# Patient Record
Sex: Female | Born: 1985 | Race: White | Hispanic: No | Marital: Married | Smoking: Never smoker
Health system: Southern US, Community
[De-identification: ages and names within clinical notes are randomized; demographics above are authoritative.]

## PROBLEM LIST (undated history)

## (undated) DIAGNOSIS — T7840XA Allergy, unspecified, initial encounter: Secondary | ICD-10-CM

## (undated) DIAGNOSIS — Z789 Other specified health status: Secondary | ICD-10-CM

## (undated) HISTORY — PX: COSMETIC SURGERY: SHX468

## (undated) HISTORY — PX: OTHER SURGICAL HISTORY: SHX169

## (undated) HISTORY — DX: Allergy, unspecified, initial encounter: T78.40XA

## (undated) HISTORY — PX: WISDOM TOOTH EXTRACTION: SHX21

## (undated) HISTORY — PX: PATELLAR TENDON REPAIR: SHX737

---

## 2012-10-01 ENCOUNTER — Ambulatory Visit (INDEPENDENT_AMBULATORY_CARE_PROVIDER_SITE_OTHER): Payer: BC Managed Care – PPO | Admitting: Women's Health

## 2012-10-01 ENCOUNTER — Encounter: Payer: Self-pay | Admitting: Women's Health

## 2012-10-01 ENCOUNTER — Other Ambulatory Visit (HOSPITAL_COMMUNITY)
Admission: RE | Admit: 2012-10-01 | Discharge: 2012-10-01 | Disposition: A | Discharge status: Home / Self Care | Payer: BC Managed Care – PPO | Source: Ambulatory Visit | Attending: Women's Health | Admitting: Women's Health

## 2012-10-01 VITALS — BP 116/70 | Ht 68.75 in | Wt 187.0 lb

## 2012-10-01 DIAGNOSIS — Z01419 Encounter for gynecological examination (general) (routine) without abnormal findings: Secondary | ICD-10-CM | POA: Insufficient documentation

## 2012-10-01 DIAGNOSIS — E070 Hypersecretion of calcitonin: Secondary | ICD-10-CM

## 2012-10-01 DIAGNOSIS — IMO0001 Reserved for inherently not codable concepts without codable children: Secondary | ICD-10-CM

## 2012-10-01 DIAGNOSIS — Z309 Encounter for contraceptive management, unspecified: Secondary | ICD-10-CM

## 2012-10-01 DIAGNOSIS — Z113 Encounter for screening for infections with a predominantly sexual mode of transmission: Secondary | ICD-10-CM

## 2012-10-01 LAB — CBC WITH DIFFERENTIAL/PLATELET
Eosinophils Absolute: 0.1 10*3/uL (ref 0.0–0.7)
Eosinophils Relative: 1 % (ref 0–5)
HCT: 37.6 % (ref 36.0–46.0)
Hemoglobin: 13.3 g/dL (ref 12.0–15.0)
Lymphs Abs: 1.7 10*3/uL (ref 0.7–4.0)
MCH: 31.1 pg (ref 26.0–34.0)
MCV: 88.1 fL (ref 78.0–100.0)
Monocytes Relative: 6 % (ref 3–12)
Neutrophils Relative %: 69 % (ref 43–77)
RBC: 4.27 MIL/uL (ref 3.87–5.11)

## 2012-10-01 MED ORDER — NORETHINDRONE ACET-ETHINYL EST 1-20 MG-MCG PO TABS: 1.0000 | ORAL_TABLET | Freq: Every day | ORAL | Status: DC

## 2012-10-01 NOTE — Progress Notes (Addendum)
Laurie Gardner Jan 03, 1986 [id number]    History:    The patient presents for annual exam.  Regular monthly cycle until last month, cycle was 2 weeks late, 2 negative home U PTs. New partner in last 6 months/condoms. History of normal Paps, last one being greater than 2 years ago. Gardasil series completed in high school. Has gained approximately 40 pounds in last 5 years.  Past medical history, past surgical history, family history and social history were all reviewed and documented in the EPIC chart. Graduated from [employer], basketball scholarship. Wanting to be ultrasonographer. Mother with melanoma.  ROS:  A  ROS was performed and pertinent positives and negatives are included in the history.  Exam:  Filed Vitals:   10/01/12 1456  BP: 116/70    General appearance:  Normal Head/Neck:  Normal, without cervical or supraclavicular adenopathy. Thyroid:  Symmetrical, normal in size, without palpable masses or nodularity. Respiratory  Effort:  Normal  Auscultation:  Clear without wheezing or rhonchi Cardiovascular  Auscultation:  Regular rate, without rubs, murmurs or gallops  Edema/varicosities:  Not grossly evident Abdominal  Soft,nontender, without masses, guarding or rebound.  Liver/spleen:  No organomegaly noted  Hernia:  None appreciated  Skin  Inspection:  Grossly normal  Palpation:  Grossly normal Neurologic/psychiatric  Orientation:  Normal with appropriate conversation.  Mood/affect:  Normal  Genitourinary    Breasts: Examined lying and sitting.     Right: Without masses, retractions, discharge or axillary adenopathy.     Left: Without masses, retractions, discharge or axillary adenopathy.   Inguinal/mons:  Normal without inguinal adenopathy  External genitalia:  Normal  BUS/Urethra/Skene's glands:  Normal  Bladder:  Normal  Vagina:  Normal  Cervix:  Normal  Uterus:   normal in size, shape and contour.  Midline and mobile  Adnexa/parametria:      Rt: Without masses or tenderness.   Lt: Without masses or tenderness.  Anus and perineum: Normal  Digital rectal exam: Normal sphincter tone without palpated masses or tenderness  Assessment/Plan:  27 y.o. S. WF G0 for annual exam with no complaints.  Normal GYN exam Contraception counseling STD screen  Plan: Contraception options reviewed, will try Loestrin 1/20 prescription, proper use, slight risk for blood clots and strokes reviewed. Reviewed importance of condoms until next cycle and first month on. Has used pills in the past without a problem. SBE's, increase regular exercise and decrease calories for weight loss, MVI daily encouraged. CBC, TSH, UA, Pap, GC/Chlamydia, HIV, hep B, C., RPR. Continue annual skin checks.     Harrington Challenger [facility], 3:27 PM 10/01/2012

## 2012-10-01 NOTE — Patient Instructions (Signed)

## 2012-10-02 LAB — URINALYSIS W MICROSCOPIC + REFLEX CULTURE
Casts: NONE SEEN
Glucose, UA: NEGATIVE mg/dL
Hgb urine dipstick: NEGATIVE
Leukocytes, UA: NEGATIVE
pH: 7 (ref 5.0–8.0)

## 2012-10-02 LAB — TSH: TSH: 1.562 u[IU]/mL (ref 0.350–4.500)

## 2012-10-02 LAB — HEPATITIS C ANTIBODY: HCV Ab: NEGATIVE

## 2012-10-02 LAB — GC/CHLAMYDIA PROBE AMP
CT Probe RNA: NEGATIVE
GC Probe RNA: NEGATIVE

## 2012-10-03 ENCOUNTER — Other Ambulatory Visit: Payer: Self-pay | Admitting: Women's Health

## 2012-10-03 MED ORDER — FLUCONAZOLE 150 MG PO TABS: 150.0000 mg | ORAL_TABLET | Freq: Once | ORAL | Status: DC

## 2012-11-15 ENCOUNTER — Telehealth: Payer: Self-pay | Admitting: *Deleted

## 2012-11-15 NOTE — Telephone Encounter (Signed)
Pt called c/o brownish discharge with started new birth control pills, I explained to pt that this is not uncommon may have irregualar spotting while on pills. I told her to make OV if any signs of infection. Pt okay with all the above.

## 2013-05-30 ENCOUNTER — Other Ambulatory Visit: Payer: Self-pay

## 2013-06-16 ENCOUNTER — Encounter: Payer: Self-pay | Admitting: Women's Health

## 2013-06-18 ENCOUNTER — Encounter: Payer: Self-pay | Admitting: Women's Health

## 2014-01-08 ENCOUNTER — Ambulatory Visit (INDEPENDENT_AMBULATORY_CARE_PROVIDER_SITE_OTHER): Payer: BC Managed Care – PPO | Admitting: Physician Assistant

## 2014-01-08 ENCOUNTER — Encounter: Payer: Self-pay | Admitting: Physician Assistant

## 2014-01-08 VITALS — BP 110/62 | HR 80 | Temp 99.7°F | Resp 16 | Ht 68.75 in | Wt 170.6 lb

## 2014-01-08 DIAGNOSIS — T148XXA Other injury of unspecified body region, initial encounter: Secondary | ICD-10-CM

## 2014-01-08 DIAGNOSIS — Z13220 Encounter for screening for lipoid disorders: Secondary | ICD-10-CM

## 2014-01-08 DIAGNOSIS — Z1329 Encounter for screening for other suspected endocrine disorder: Secondary | ICD-10-CM

## 2014-01-08 DIAGNOSIS — Z0000 Encounter for general adult medical examination without abnormal findings: Secondary | ICD-10-CM

## 2014-01-08 LAB — CBC WITH DIFFERENTIAL/PLATELET
BASOS PCT: 0 % (ref 0–1)
Basophils Absolute: 0 10*3/uL (ref 0.0–0.1)
EOS ABS: 0.1 10*3/uL (ref 0.0–0.7)
Eosinophils Relative: 1 % (ref 0–5)
HCT: 38 % (ref 36.0–46.0)
HEMOGLOBIN: 13.4 g/dL (ref 12.0–15.0)
LYMPHS ABS: 1.9 10*3/uL (ref 0.7–4.0)
Lymphocytes Relative: 29 % (ref 12–46)
MCH: 31.4 pg (ref 26.0–34.0)
MCHC: 35.3 g/dL (ref 30.0–36.0)
MCV: 89 fL (ref 78.0–100.0)
MONOS PCT: 5 % (ref 3–12)
Monocytes Absolute: 0.3 10*3/uL (ref 0.1–1.0)
Neutro Abs: 4.2 10*3/uL (ref 1.7–7.7)
Neutrophils Relative %: 65 % (ref 43–77)
PLATELETS: 218 10*3/uL (ref 150–400)
RBC: 4.27 MIL/uL (ref 3.87–5.11)
RDW: 13.4 % (ref 11.5–15.5)
WBC: 6.4 10*3/uL (ref 4.0–10.5)

## 2014-01-08 LAB — POCT URINALYSIS DIPSTICK
BILIRUBIN UA: NEGATIVE
GLUCOSE UA: NEGATIVE
KETONES UA: NEGATIVE
Leukocytes, UA: NEGATIVE
NITRITE UA: NEGATIVE
PH UA: 7
Protein, UA: NEGATIVE
RBC UA: NEGATIVE
SPEC GRAV UA: 1.01
Urobilinogen, UA: 0.2

## 2014-01-08 NOTE — Progress Notes (Signed)
   Subjective:    Patient ID: Laurie Gardner, female    DOB: Nov 24, 1985, 28 y.o.   MRN: [id number]  HPI    Review of Systems  Constitutional: Negative.   HENT: Negative.   Eyes: Negative.   Respiratory: Negative.   Cardiovascular: Negative.   Gastrointestinal: Negative.   Endocrine: Negative.   Genitourinary: Negative.   Musculoskeletal: Negative.   Skin: Negative.   Allergic/Immunologic: Negative.   Neurological: Negative.   Hematological: Negative.   Psychiatric/Behavioral: Negative.        Objective:   Physical Exam        Assessment & Plan:

## 2014-01-08 NOTE — Progress Notes (Signed)
   Subjective:    Patient ID: Laurie Gardner, female    DOB: [birth year], 28 y.o.   MRN: [id number]  HPI Pt presents to clinic for her yearly physical.  She is doing well.  Her only concern is she intermittently gets big tender bruises but does not that she does tend to run into things.  She has had no epistaxis or bleeding gums.  She is trying to get pregnant and has not yet started prenatal vitamins.  Plays basketball for fun.  Review of Systems  Constitutional: Negative.   Eyes: Negative.   Respiratory: Negative.   Cardiovascular: Negative.   Gastrointestinal: Positive for nausea (rare in the am esp after a late full meal). Negative for vomiting, abdominal pain, diarrhea, constipation and blood in stool.  Endocrine: Negative.   Genitourinary: Negative.   Musculoskeletal: Negative.   Skin: Negative.   Allergic/Immunologic: Negative.   Neurological: Positive for headaches (rare - relieved with tylenol or motrin).  Hematological: Bruises/bleeds easily.  Psychiatric/Behavioral: Negative.        Objective:   Physical Exam  Vitals reviewed. Constitutional: She is oriented to person, place, and time. She appears well-developed and well-nourished.  HENT:  Head: Normocephalic and atraumatic.  Right Ear: Hearing, tympanic membrane, external ear and ear canal normal.  Left Ear: Hearing, tympanic membrane, external ear and ear canal normal.  Nose: Nose normal.  Mouth/Throat: Uvula is midline, oropharynx is clear and moist and mucous membranes are normal.  Eyes: Conjunctivae and EOM are normal. Pupils are equal, round, and reactive to light.  Neck: Normal range of motion. Neck supple.  Cardiovascular: Normal rate, regular rhythm and normal heart sounds.   No murmur heard. Pulmonary/Chest: Effort normal and breath sounds normal.  Abdominal: Soft. Bowel sounds are normal.  Musculoskeletal: Normal range of motion.  Neurological: She is alert and oriented to person, place, and time. She has  normal reflexes.  Skin: Skin is warm and dry.  ecchymosis on posterior calf near posterior knee fossa - no associated erythema  Psychiatric: She has a normal mood and affect. Her behavior is normal. Judgment and thought content normal.      Assessment & Plan:  Annual physical exam - Plan: COMPLETE METABOLIC PANEL WITH GFR, POCT urinalysis dipstick  Screening for cholesterol level - Plan: Lipid panel  Screening for thyroid disorder - Plan: TSH  Bruising - without other bleeding problems this is most likely related to trauma she will continue to monitor - Plan: CBC with Differential  Pt should start a PNV due to trying to get pregnant.  Windell Hummingbird PA-C  [facility] 01/08/2014 1:15 PM

## 2014-01-08 NOTE — Patient Instructions (Signed)
I will contact you with your lab results as soon as they are available.   If you have not heard from me in 2 weeks, please contact me.  The fastest way to get your results is to register for My Chart (see the instructions on the last page of this printout).   

## 2014-01-09 LAB — COMPLETE METABOLIC PANEL WITH GFR
ALT: 10 U/L (ref 0–35)
AST: 14 U/L (ref 0–37)
Albumin: 4.2 g/dL (ref 3.5–5.2)
Alkaline Phosphatase: 43 U/L (ref 39–117)
BILIRUBIN TOTAL: 0.4 mg/dL (ref 0.2–1.2)
BUN: 8 mg/dL (ref 6–23)
CO2: 24 meq/L (ref 19–32)
CREATININE: 0.68 mg/dL (ref 0.50–1.10)
Calcium: 9.1 mg/dL (ref 8.4–10.5)
Chloride: 103 mEq/L (ref 96–112)
GLUCOSE: 77 mg/dL (ref 70–99)
Potassium: 4.1 mEq/L (ref 3.5–5.3)
Sodium: 139 mEq/L (ref 135–145)
Total Protein: 6.7 g/dL (ref 6.0–8.3)

## 2014-01-09 LAB — LIPID PANEL
CHOL/HDL RATIO: 3.8 ratio
CHOLESTEROL: 144 mg/dL (ref 0–200)
HDL: 38 mg/dL — ABNORMAL LOW (ref >39)
LDL Cholesterol: 78 mg/dL (ref 0–99)
TRIGLYCERIDES: 140 mg/dL (ref <150)
VLDL: 28 mg/dL (ref 0–40)

## 2014-01-09 LAB — TSH: TSH: 1.299 u[IU]/mL (ref 0.350–4.500)

## 2014-06-26 ENCOUNTER — Encounter: Payer: Self-pay | Admitting: Physician Assistant

## 2014-06-27 NOTE — Telephone Encounter (Signed)
To scheduling.

## 2014-12-12 ENCOUNTER — Encounter: Payer: Self-pay | Admitting: Physician Assistant

## 2014-12-12 ENCOUNTER — Telehealth: Payer: Self-pay | Admitting: Family Medicine

## 2014-12-12 NOTE — Telephone Encounter (Signed)
lmom to call us back to set up another appt with Windell Hummingbird for a CPE

## 2014-12-17 ENCOUNTER — Encounter: Payer: BC Managed Care – PPO | Admitting: Physician Assistant

## 2014-12-30 ENCOUNTER — Other Ambulatory Visit: Payer: Self-pay | Admitting: Obstetrics and Gynecology

## 2014-12-31 LAB — CYTOLOGY - PAP

## 2015-01-28 ENCOUNTER — Encounter: Payer: Self-pay | Admitting: Physician Assistant

## 2015-02-11 ENCOUNTER — Ambulatory Visit (INDEPENDENT_AMBULATORY_CARE_PROVIDER_SITE_OTHER): Payer: BLUE CROSS/BLUE SHIELD | Admitting: Physician Assistant

## 2015-02-11 ENCOUNTER — Encounter: Payer: Self-pay | Admitting: Physician Assistant

## 2015-02-11 VITALS — BP 112/70 | HR 101 | Temp 98.7°F | Resp 16 | Ht 68.75 in | Wt 157.8 lb

## 2015-02-11 DIAGNOSIS — Z0000 Encounter for general adult medical examination without abnormal findings: Secondary | ICD-10-CM | POA: Diagnosis not present

## 2015-02-11 NOTE — Progress Notes (Signed)
Laurie Gardner  MRN: [id number] DOB: 10/11/1985  Subjective:  Pt presents to clinic for her CPE.  She is having no problems or concerns this year.  She is engaged and they are still not preventing pregnancy.  Last dental exam: > 1 year Last vision exam: no Last pap smear: June 2016 Vaccinations      Tetanus - UTD      HPV - UTD  There are no active problems to display for this patient.   No current outpatient prescriptions on file prior to visit.   No current facility-administered medications on file prior to visit.    Allergies  Allergen Reactions  . Penicillins Rash    History   Social History  . Marital Status: Single    Spouse Name: N/A  . Number of Children: N/A  . Years of Education: N/A   Occupational History  . 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 Smoking status: Never Smoker   . Smokeless tobacco: Never Used  . Alcohol Use: Yes     Comment: not often  . Drug Use: No  . Sexual Activity:    Partners: Male    Birth Control/ Protection: Condom   Other Topics Concern  . None   Social History Narrative   Single, engaged. Education: [employer]. Exercise: Yes   Works at [employer] - athletic department    Past Surgical History  Procedure Laterality Date  . Arthroscopic knee surgery      left knee  . Patellar tendon repair Left     Family History  Problem Relation Age of Onset  . Melanoma Mother   . Migraines Mother   . Melanoma Maternal Grandmother   . Cancer Paternal Grandmother     Review of Systems  Constitutional: Negative.   HENT: Negative.   Eyes: Negative.   Respiratory: Negative.   Cardiovascular: Negative.   Gastrointestinal: Negative.   Endocrine: Negative.   Genitourinary: Negative.   Musculoskeletal: Negative.   Skin: Negative.   Allergic/Immunologic: Negative.   Neurological: Negative.   Hematological: Negative.   Psychiatric/Behavioral: Negative.    Objective:  BP 112/70 mmHg  Pulse  101  Temp(Src) 98.7 F (37.1 C) (Oral)  Resp 16  Ht 5' 8.75" (1.746 m)  Wt 157 lb 12.8 oz (71.578 kg)  BMI 23.48 kg/m2  SpO2 98%  LMP 01/27/2015  Physical Exam  Constitutional: She is oriented to person, place, and time and well-developed, well-nourished, and in no distress.  HENT:  Head: Normocephalic and atraumatic.  Right Ear: Hearing, tympanic membrane, external ear and ear canal normal.  Left Ear: Hearing, tympanic membrane, external ear and ear canal normal.  Nose: Nose normal.  Mouth/Throat: Uvula is midline, oropharynx is clear and moist and mucous membranes are normal.  Eyes: Conjunctivae and EOM are normal. Pupils are equal, round, and reactive to light.  Neck: Trachea normal and normal range of motion. Neck supple. No thyroid mass and no thyromegaly present.  Cardiovascular: Normal rate, regular rhythm and normal heart sounds.   No murmur heard. Pulmonary/Chest: Effort normal and breath sounds normal. She has no wheezes.  Abdominal: Soft. Bowel sounds are normal. There is no tenderness.  Musculoskeletal: Normal range of motion.  Linear scar left anterior knee  Lymphadenopathy:    She has no cervical adenopathy.  Neurological: She is alert and oriented to person, place, and time. She has normal motor skills, normal sensation, normal strength and normal reflexes. Gait normal.  Skin: Skin is  warm and dry.  Psychiatric: Mood, memory, affect and judgment normal.     Visual Acuity Screening   Right eye Left eye Both eyes  Without correction: 20/20 20/15 20/13   With correction:       Assessment and Plan :  Annual physical exam   Anticipatory Guidance given.  Questions answered.  Windell Hummingbird PA-C  [facility] 02/11/2015 1:39 PM

## 2015-04-13 ENCOUNTER — Encounter: Payer: Self-pay | Admitting: Physician Assistant

## 2015-12-22 ENCOUNTER — Encounter: Payer: Self-pay | Admitting: Physician Assistant

## 2015-12-25 ENCOUNTER — Ambulatory Visit (INDEPENDENT_AMBULATORY_CARE_PROVIDER_SITE_OTHER): Payer: BLUE CROSS/BLUE SHIELD | Admitting: Family Medicine

## 2015-12-25 VITALS — BP 124/78 | HR 89 | Temp 98.1°F | Resp 16 | Ht 68.75 in | Wt 170.8 lb

## 2015-12-25 DIAGNOSIS — Z808 Family history of malignant neoplasm of other organs or systems: Secondary | ICD-10-CM

## 2015-12-25 DIAGNOSIS — D225 Melanocytic nevi of trunk: Secondary | ICD-10-CM | POA: Diagnosis not present

## 2015-12-25 DIAGNOSIS — Z0000 Encounter for general adult medical examination without abnormal findings: Secondary | ICD-10-CM

## 2015-12-25 NOTE — Patient Instructions (Addendum)
IF you received an x-ray today, you will receive an invoice from [facility]. Please contact [facility] at [phone] with questions or concerns regarding your invoice.   IF you received labwork today, you will receive an invoice from [facility] [employer]. Please contact [facility] at [phone] with questions or concerns regarding your invoice.   Our billing staff will not be able to assist you with questions regarding bills from these companies.  You will be contacted with the lab results as soon as they are available. The fastest way to get your results is to activate your My Chart account. Instructions are located on the last page of this paperwork. If you have not heard from [place] regarding the results in 2 weeks, please contact this office.    Keep follow up as planned with Dr. Julien Girt and your dentist appointment. Information below regarding general recommendations for keeping you healthy. If your left knee becomes more symptomatic or you would like to meet with the orthopedist, please let me know and I'll be happy to send in a referral.  Keeping You Healthy  Get These Tests 1. Blood Pressure- Have your blood pressure checked once a year by your health care provider.  Normal blood pressure is 120/80. 2. Weight- Have your body mass index (BMI) calculated to screen for obesity.  BMI is measure of body fat based on height and weight.  You can also calculate your own BMI at GravelBags.it. 3. Cholesterol- Have your cholesterol checked every 5 years starting at age 51 then yearly starting at age 59. 56. Chlamydia, HIV, and other sexually transmitted diseases- Get screened every year until age 72, then within three months of each new sexual provider. 5. Pap Test - Every 1-5 years; discuss with your health care provider. 6. Mammogram- Every 1-2 years starting at age 37--50  Take these medicines  Calcium with Vitamin D-Your body needs 1200 mg  of Calcium each day and [phone] IU of Vitamin D daily.  Your body can only absorb 500 mg of Calcium at a time so Calcium must be taken in 2 or 3 divided doses throughout the day.  Multivitamin with folic acid- Once daily if it is possible for you to become pregnant.  Get these Immunizations  Gardasil-Series of three doses; prevents HPV related illness such as genital warts and cervical cancer.  Menactra-Single dose; prevents meningitis.  Tetanus shot- Every 10 years.  Flu shot-Every year.  Take these steps 1. Do not smoke-Your healthcare provider can help you quit.  For tips on how to quit go to www.smokefree.gov or call 1-800 QUITNOW. 2. Be physically active- Exercise 5 days a week for at least 30 minutes.  If you are not already physically active, start slow and gradually work up to 30 minutes of moderate physical activity.  Examples of moderate activity include walking briskly, dancing, swimming, bicycling, etc. 3. Breast Cancer- A self breast exam every month is important for early detection of breast cancer.  For more information and instruction on self breast exams, ask your healthcare provider or https://www.patel.info/. 4. Eat a healthy diet- Eat a variety of healthy foods such as fruits, vegetables, whole grains, low fat milk, low fat cheeses, yogurt, lean meats, poultry and fish, beans, nuts, tofu, etc.  For more information go to www. Thenutritionsource.org 5. Drink alcohol in moderation- Limit alcohol intake to one drink or less per day. Never drink and drive. 6. Depression- Your emotional health is as important as your physical health.  If you're feeling down or losing interest in things you normally enjoy please talk to your healthcare provider about being screened for depression. 7. Dental visit- Brush and floss your teeth twice daily; visit your dentist twice a year. 8. Eye doctor- Get an eye exam at least every 2 years. 9. Helmet use- Always wear a  helmet when riding a bicycle, motorcycle, rollerblading or skateboarding. 30. Safe sex- If you may be exposed to sexually transmitted infections, use a condom. 11. Seat belts- Seat belts can save your live; always wear one. 12. Smoke/Carbon Monoxide detectors- These detectors need to be installed on the appropriate level of your home. Replace batteries at least once a year. 13. Skin cancer- When out in the sun please cover up and use sunscreen 15 SPF or higher. 14. Violence- If anyone is threatening or hurting you, please tell your healthcare provider.

## 2015-12-25 NOTE — Progress Notes (Signed)
Subjective:  By signing my name below, I, Laurie Gardner, attest that this documentation has been prepared under the direction and in the presence of Laurie Ray, MD. Electronically Signed: Moises Gardner, [place]. 12/25/2015 , 11:15 AM .  Patient was seen in Room 13 .   Patient ID: Laurie Gardner, female    DOB: 1985-07-29, 30 y.o.   MRN: [id number] Chief Complaint  Patient presents with  . Annual Exam   HPI Laurie Gardner is a 30 y.o. female Here for annual physical.   Pregnancy She started to take prenatal vitamins 7-8 months ago. She has been trying to get pregnant for 2 years.   Last STI testing was 2 years ago. She denies any new partners.   PSHx She's had 2 left knee surgeries for patellar tendonitis done in [place]. She played basketball in the past.   Cancer Screening Cervical cancer screening: had a pap in June 2016, which was negative OBGYN: She sees Dr. Julien Girt and has an appointment later this month.   Moles over her back She mentions having a few moles over her back that she has concerns of. She notes having 2 moles taken off when she was in high school that were both benign. She informs wearing sunblock when she goes out. She has family history of skin cancer: mother, paternal and maternal grandmothers. She also mentions her brother having multiple moles (benign) that were removed.   She denies family history of other cancer.   Immunizations Immunization History  Administered Date(s) Administered  . DTaP 09/16/1988, 11/18/1988, 01/27/1989, 09/09/1992, 02/01/1993  . HPV Quadrivalent 04/26/2006, 06/27/2006, 10/30/2006  . Hepatitis A 12/05/2003, 01/04/2007  . Hepatitis B 04/27/2000, 06/02/2000, 11/02/2000  . HiB (PRP-OMP) 01/05/1990  . IPV 09/16/1988, 11/18/1988, 01/05/1990, 09/09/1992  . Influenza Whole 03/25/2013  . Influenza-Unspecified 05/12/2014  . MMR 01/05/1990, 09/09/1992  . Meningococcal Conjugate 01/11/2007  . Td 12/05/2003, 01/04/2007  . Tdap  01/04/2007   She mentions receiving flu shot in fall 2016.   Depression Depression screen [facility] 2/9 12/25/2015 02/11/2015 01/08/2014  Decreased Interest 0 0 0  Down, Depressed, Hopeless 0 0 0  PHQ - 2 Score 0 0 0   She denies having dysphoric mood.   Vision  Visual Acuity Screening   Right eye Left eye Both eyes  Without correction: '20/15 20/20 20/20 '  With correction:      She denies wearing corrective lenses or contacts.   Dentist She made an appointment with dentist in a couple of months.   Exercise She exercises about 4-5 times a week.   Work She works in Adult nurse at [employer].   There are no active problems to display for this patient.  No past medical history on file. Past Surgical History  Procedure Laterality Date  . Arthroscopic knee surgery      left knee  . Patellar tendon repair Left    Allergies  Allergen Reactions  . Penicillins Rash   Prior to Admission medications   Medication Sig Start Date End Date Taking? Authorizing Provider  Prenatal Vit-Fe Fumarate-FA (PRENATAL MULTIVITAMIN) TABS tablet Take 1 tablet by mouth daily at 12 noon.   Yes Historical Provider, MD   Social History   Social History  . Marital Status: Married    Spouse Name: N/A  . Number of Children: N/A  . Years of Education: N/A   Occupational History  . 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 Smoking status: Never Smoker   .  Smokeless tobacco: Never Used  . Alcohol Use: Yes     Comment: not often  . Drug Use: No  . Sexual Activity:    Partners: Male    Birth Control/ Protection: Condom   Other Topics Concern  . Not on file   Social History Narrative   Single, engaged. Education: [employer]. Exercise: Yes   Works at [employer] - athletic department   Review of Systems 13 point ROS - negative     Objective:   Physical Exam  Constitutional: She is oriented to person, place, and time. She appears well-developed and  well-nourished.  HENT:  Head: Normocephalic and atraumatic.  Right Ear: External ear normal.  Left Ear: External ear normal.  Mouth/Throat: Oropharynx is clear and moist.  Eyes: Conjunctivae are normal. Pupils are equal, round, and reactive to light.  Neck: Normal range of motion. Neck supple. No thyromegaly present.  Cardiovascular: Normal rate, regular rhythm, normal heart sounds and intact distal pulses.   No murmur heard. Pulmonary/Chest: Effort normal and breath sounds normal. No respiratory distress. She has no wheezes.  Abdominal: Soft. Bowel sounds are normal. There is no tenderness.  Musculoskeletal: Normal range of motion. She exhibits no edema or tenderness.  Well healed scar anterior knee, slight bony tenderness at the patellar, full extension with full strength  Lymphadenopathy:    She has no cervical adenopathy.  Neurological: She is alert and oriented to person, place, and time.  Skin: Skin is warm and dry. No rash noted.  multiple scattered nevi, largest measures 19m with slight hyperpigmented center, lighter brown peripheries, no surrounding erythema, mildly elevated, few darker nevi inferiorly measuring 267m Psychiatric: She has a normal mood and affect. Her behavior is normal. Thought content normal.  Vitals reviewed.    Filed Vitals:   12/25/15 0954  BP: 124/78  Pulse: 89  Temp: 98.1 F (36.7 C)  TempSrc: Oral  Resp: 16  Height: 5' 8.75" (1.746 m)  Weight: 170 lb 12.8 oz (77.474 kg)  SpO2: 100%      Assessment & Plan:   Laurie Gardner a [age].o. female Annual physical exam  --anticipatory guidance as below in AVS, screening labs above. Health maintenance items as above in HPI discussed/recommended as applicable.   - Keep follow-up with gynecologist as planned, and upcoming dentist appointment.  - Episodic left patellar/knee pain. Status post surgery x 2. Advised if this persists, can refer [facility] for further evaluation.  Nevus  of back - Plan: Ambulatory referral to Dermatology Family history of skin cancer - Plan: Ambulatory referral to Dermatology  - Refer to dermatology for evaluation of larger nevus on bcak as well as routine skin check with family history of skin cancer.  Meds ordered this encounter  Medications  . Prenatal Vit-Fe Fumarate-FA (PRENATAL MULTIVITAMIN) TABS tablet    Sig: Take 1 tablet by mouth daily at 12 noon.   Patient Instructions       IF you received an x-Gardner today, you will receive an invoice from [facility]. Please contact [facility] at [phone] questions or concerns regarding your invoice.   IF you received labwork today, you will receive an invoice from [facility] at [phone] questions or concerns regarding your invoice.   Our billing staff will not be able to assist you with questions regarding bills from these companies.  You will be contacted with the lab results as soon as they are available. The  fastest way to get your results is to activate your My Chart account. Instructions are located on the last page of this paperwork. If you have not heard from [place] regarding the results in 2 weeks, please contact this office.    Keep follow up as planned with Dr. Julien Girt and your dentist appointment. Information below regarding general recommendations for keeping you healthy. If your left knee becomes more symptomatic or you would like to meet with the orthopedist, please let me know and I'll be happy to send in a referral.  Keeping You Healthy  Get These Tests 1. Gardner Pressure- Have your Gardner pressure checked once a year by your health care provider.  Normal Gardner pressure is 120/80. 2. Weight- Have your body mass index (BMI) calculated to screen for obesity.  BMI is measure of body fat based on height and weight.  You can also calculate your own BMI at GravelBags.it. 3. Cholesterol-  Have your cholesterol checked every 5 years starting at age 38 then yearly starting at age 4. 44. Chlamydia, HIV, and other sexually transmitted diseases- Get screened every year until age 71, then within three months of each new sexual provider. 5. Pap Test - Every 1-5 years; discuss with your health care provider. 6. Mammogram- Every 1-2 years starting at age 9--50  Take these medicines  Calcium with Vitamin D-Your body needs 1200 mg of Calcium each day and 4033671383 IU of Vitamin D daily.  Your body can only absorb 500 mg of Calcium at a time so Calcium must be taken in 2 or 3 divided doses throughout the day.  Multivitamin with folic acid- Once daily if it is possible for you to become pregnant.  Get these Immunizations  Gardasil-Series of three doses; prevents HPV related illness such as genital warts and cervical cancer.  Menactra-Single dose; prevents meningitis.  Tetanus shot- Every 10 years.  Flu shot-Every year.  Take these steps 1. Do not smoke-Your healthcare provider can help you quit.  For tips on how to quit go to www.smokefree.gov or call 1-800 QUITNOW. 2. Be physically active- Exercise 5 days a week for at least 30 minutes.  If you are not already physically active, start slow and gradually work up to 30 minutes of moderate physical activity.  Examples of moderate activity include walking briskly, dancing, swimming, bicycling, etc. 3. Breast Cancer- A self breast exam every month is important for early detection of breast cancer.  For more information and instruction on self breast exams, ask your healthcare provider or https://www.patel.info/. 4. Eat a healthy diet- Eat a variety of healthy foods such as fruits, vegetables, whole grains, low fat milk, low fat cheeses, yogurt, lean meats, poultry and fish, beans, nuts, tofu, etc.  For more information go to www. Thenutritionsource.org 5. Drink alcohol in moderation- Limit alcohol intake to one drink or  less per day. Never drink and drive. 6. Depression- Your emotional health is as important as your physical health.  If you're feeling down or losing interest in things you normally enjoy please talk to your healthcare provider about being screened for depression. 7. Dental visit- Brush and floss your teeth twice daily; visit your dentist twice a year. 8. Eye doctor- Get an eye exam at least every 2 years. 9. Helmet use- Always wear a helmet when riding a bicycle, motorcycle, rollerblading or skateboarding. 71. Safe sex- If you may be exposed to sexually transmitted infections, use a condom. 11. Seat belts- Seat belts can save your live; always wear  one. 12. Smoke/Carbon Monoxide detectors- These detectors need to be installed on the appropriate level of your home. Replace batteries at least once a year. 13. Skin cancer- When out in the sun please cover up and use sunscreen 15 SPF or higher. 14. Violence- If anyone is threatening or hurting you, please tell your healthcare provider.              I personally performed the services described in this documentation, which was scribed in my presence. The recorded information has been reviewed and considered, and addended by me as needed.   Signed,   Laurie Ray, MD [facility].  12/25/2015 11:21 AM

## 2016-01-11 DIAGNOSIS — Z01419 Encounter for gynecological examination (general) (routine) without abnormal findings: Secondary | ICD-10-CM | POA: Diagnosis not present

## 2016-01-11 DIAGNOSIS — Z6825 Body mass index (BMI) 25.0-25.9, adult: Secondary | ICD-10-CM | POA: Diagnosis not present

## 2016-02-04 DIAGNOSIS — R1904 Left lower quadrant abdominal swelling, mass and lump: Secondary | ICD-10-CM | POA: Diagnosis not present

## 2016-02-26 DIAGNOSIS — D224 Melanocytic nevi of scalp and neck: Secondary | ICD-10-CM | POA: Diagnosis not present

## 2016-02-26 DIAGNOSIS — D485 Neoplasm of uncertain behavior of skin: Secondary | ICD-10-CM | POA: Diagnosis not present

## 2016-02-26 DIAGNOSIS — L814 Other melanin hyperpigmentation: Secondary | ICD-10-CM | POA: Diagnosis not present

## 2016-02-26 DIAGNOSIS — D2262 Melanocytic nevi of left upper limb, including shoulder: Secondary | ICD-10-CM | POA: Diagnosis not present

## 2016-02-26 DIAGNOSIS — D225 Melanocytic nevi of trunk: Secondary | ICD-10-CM | POA: Diagnosis not present

## 2016-08-03 DIAGNOSIS — Z3200 Encounter for pregnancy test, result unknown: Secondary | ICD-10-CM | POA: Diagnosis not present

## 2016-08-18 DIAGNOSIS — N911 Secondary amenorrhea: Secondary | ICD-10-CM | POA: Diagnosis not present

## 2016-08-24 DIAGNOSIS — Z3401 Encounter for supervision of normal first pregnancy, first trimester: Secondary | ICD-10-CM | POA: Diagnosis not present

## 2016-08-24 DIAGNOSIS — Z3685 Encounter for antenatal screening for Streptococcus B: Secondary | ICD-10-CM | POA: Diagnosis not present

## 2016-08-24 DIAGNOSIS — Z3480 Encounter for supervision of other normal pregnancy, unspecified trimester: Secondary | ICD-10-CM | POA: Diagnosis not present

## 2016-08-24 DIAGNOSIS — Z13228 Encounter for screening for other metabolic disorders: Secondary | ICD-10-CM | POA: Diagnosis not present

## 2016-09-08 DIAGNOSIS — Z3401 Encounter for supervision of normal first pregnancy, first trimester: Secondary | ICD-10-CM | POA: Diagnosis not present

## 2016-09-08 DIAGNOSIS — Z3480 Encounter for supervision of other normal pregnancy, unspecified trimester: Secondary | ICD-10-CM | POA: Diagnosis not present

## 2016-09-08 DIAGNOSIS — Z113 Encounter for screening for infections with a predominantly sexual mode of transmission: Secondary | ICD-10-CM | POA: Diagnosis not present

## 2016-09-21 DIAGNOSIS — Z3491 Encounter for supervision of normal pregnancy, unspecified, first trimester: Secondary | ICD-10-CM | POA: Diagnosis not present

## 2016-09-21 DIAGNOSIS — Z3682 Encounter for antenatal screening for nuchal translucency: Secondary | ICD-10-CM | POA: Diagnosis not present

## 2016-09-21 DIAGNOSIS — Z36 Encounter for antenatal screening for chromosomal anomalies: Secondary | ICD-10-CM | POA: Diagnosis not present

## 2016-11-07 DIAGNOSIS — Z363 Encounter for antenatal screening for malformations: Secondary | ICD-10-CM | POA: Diagnosis not present

## 2016-11-07 DIAGNOSIS — Z3A19 19 weeks gestation of pregnancy: Secondary | ICD-10-CM | POA: Diagnosis not present

## 2016-11-07 DIAGNOSIS — Z3480 Encounter for supervision of other normal pregnancy, unspecified trimester: Secondary | ICD-10-CM | POA: Diagnosis not present

## 2016-11-13 ENCOUNTER — Encounter: Payer: Self-pay | Admitting: Family Medicine

## 2016-12-06 ENCOUNTER — Encounter: Payer: Self-pay | Admitting: Physician Assistant

## 2016-12-06 ENCOUNTER — Ambulatory Visit (INDEPENDENT_AMBULATORY_CARE_PROVIDER_SITE_OTHER): Payer: BLUE CROSS/BLUE SHIELD | Admitting: Physician Assistant

## 2016-12-06 VITALS — BP 127/81 | HR 102 | Temp 98.3°F | Resp 18 | Ht 70.0 in | Wt 201.0 lb

## 2016-12-06 DIAGNOSIS — Z3A23 23 weeks gestation of pregnancy: Secondary | ICD-10-CM

## 2016-12-06 DIAGNOSIS — D2230 Melanocytic nevi of unspecified part of face: Secondary | ICD-10-CM

## 2016-12-06 DIAGNOSIS — Z0000 Encounter for general adult medical examination without abnormal findings: Secondary | ICD-10-CM

## 2016-12-06 DIAGNOSIS — O1202 Gestational edema, second trimester: Secondary | ICD-10-CM | POA: Diagnosis not present

## 2016-12-06 NOTE — Patient Instructions (Signed)
     IF you received an x-ray today, you will receive an invoice from [facility]. Please contact [facility] at [phone] with questions or concerns regarding your invoice.   IF you received labwork today, you will receive an invoice from [facility]. Please contact [facility] at [phone] with questions or concerns regarding your invoice.   Our billing staff will not be able to assist you with questions regarding bills from these companies.  You will be contacted with the lab results as soon as they are available. The fastest way to get your results is to activate your My Chart account. Instructions are located on the last page of this paperwork. If you have not heard from us regarding the results in 2 weeks, please contact this office.     

## 2016-12-06 NOTE — Progress Notes (Signed)
Laurie Gardner  MRN: [id number] DOB: 02-12-1986  PCP: Patient, No Pcp Per  Subjective:  Pt presents to clinic for a CPE. She is doing well. She is [redacted] week pregnant and not having any problems.  She does have a mole on her face that she would like checked out - she was outside more this spring and it has gotten darker but it has not gotten bigger.  Last dental exam: once a year Last vision exam: never Last pap: about 3 months ago - normal  Vaccinations - UTD - will need a tetanus next year - her TDAP was 2008  Typical meals for patient: 3-4 meals, snacking Typical beverage choices: water, small amounts of gatorade Exercises: 2-3 times per week for 30 mins Sleeps: sleeping well 7 hrs per night  There are no active problems to display for this patient.  Review of Systems  Constitutional: Negative.   HENT: Negative.   Eyes: Negative.   Respiratory: Negative.   Cardiovascular: Negative.   Gastrointestinal: Negative.   Endocrine: Negative.   Genitourinary: Negative.   Musculoskeletal: Negative.   Skin: Negative.   Allergic/Immunologic: Negative.   Neurological: Negative.   Hematological: Negative.   Psychiatric/Behavioral: Negative.      Current Outpatient Prescriptions on File Prior to Visit  Medication Sig Dispense Refill  . Prenatal Vit-Fe Fumarate-FA (PRENATAL MULTIVITAMIN) TABS tablet Take 1 tablet by mouth daily at 12 noon.     No current facility-administered medications on file prior to visit.     Allergies  Allergen Reactions  . Penicillins Rash    Social History   Social History  . Marital status: Married    Spouse name: N/A  . Number of children: N/A  . Years of education: N/A   Occupational History  . 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 Smoking status: Never Smoker  . Smokeless tobacco: Never Used  . Alcohol use Yes     Comment: not often  . Drug use: No  . Sexual activity: Yes    Partners: Male    Birth  control/ protection: Condom   Other Topics Concern  . None   Social History Narrative   Single, engaged. Education: [employer].    Exercise: Yes   Works at [employer] 100%   Guns in home - no    Past Surgical History:  Procedure Laterality Date  . arthroscopic knee surgery     left knee  . PATELLAR TENDON REPAIR Left     Family History  Problem Relation Age of Onset  . Melanoma Mother   . Migraines Mother   . Melanoma Maternal Grandmother   . Cancer Paternal Grandmother   . Other Paternal Grandfather      Objective:  BP 127/81   Pulse (!) 102   Temp 98.3 F (36.8 C) (Oral)   Resp 18   Ht 5\' 10"  (1.778 m)   Wt 201 lb (91.2 kg)   LMP 07/05/2016   SpO2 100%   BMI 28.84 kg/m   Physical Exam  Constitutional: She is oriented to person, place, and time and well-developed, well-nourished, and in no distress.  HENT:  Head: Normocephalic and atraumatic.  Right Ear: Hearing, tympanic membrane, external ear and ear canal normal.  Left Ear: Hearing, tympanic membrane, external ear and ear canal normal.  Nose: Nose normal.  Mouth/Throat: Uvula is midline, oropharynx is clear and moist and mucous membranes are normal.  Eyes: Conjunctivae and EOM are normal. Pupils are equal, round, and reactive to light.  Neck: Trachea normal and normal range of motion. Neck supple. No thyroid mass and no thyromegaly present.  Cardiovascular: Normal rate, regular rhythm and normal heart sounds.   No murmur heard. Pulmonary/Chest: Effort normal and breath sounds normal. She has no wheezes.  Abdominal: Soft. Bowel sounds are normal. There is no tenderness.  Musculoskeletal: Normal range of motion.       Right lower leg: She exhibits edema.       Left lower leg: She exhibits edema.  Bilateral swelling of feet and ankles about 1/3 distal shin  Lymphadenopathy:    She has no cervical adenopathy.  Neurological: She is alert and oriented to person, place,  and time. She has normal motor skills, normal sensation, normal strength and normal reflexes. Gait normal.  Skin: Skin is warm and dry.  Light brown consistent color skin lesion on right side of face - irregular border but she states the shape is the same - there is a texture associated with the lesion - pt says it feels like it always has  Psychiatric: Mood, memory, affect and judgment normal.    Wt Readings from Last 3 Encounters: (pregnant)  12/06/16 201 lb (91.2 kg)  12/25/15 170 lb 12.8 oz (77.5 kg)  02/11/15 157 lb 12.8 oz (71.6 kg)     Visual Acuity Screening   Right eye Left eye Both eyes  Without correction: 20/13 -2 20/13 20/13  With correction:       Assessment and Plan :  Annual physical exam - pt has appt with OB in 4 days for an uneventful pregnancy.  She should continue her PNV.  No lab work is indicated this year.  She will need labs next year.  Nevus of face - likely darker due to pregnancy and out in the sun - she should continue to wear sunscreen and we will continue to monitor  [redacted] weeks gestation of pregnancy - continue OB care  Leg swelling in pregnancy in second trimester - elevation - compression socks d/w pt    Windell Hummingbird PA-C  [facility] [place] 12/06/2016 10:53 AM

## 2016-12-09 DIAGNOSIS — Z362 Encounter for other antenatal screening follow-up: Secondary | ICD-10-CM | POA: Diagnosis not present

## 2016-12-09 DIAGNOSIS — Z3A24 24 weeks gestation of pregnancy: Secondary | ICD-10-CM | POA: Diagnosis not present

## 2017-01-04 DIAGNOSIS — Z3A27 27 weeks gestation of pregnancy: Secondary | ICD-10-CM | POA: Diagnosis not present

## 2017-01-04 DIAGNOSIS — Z23 Encounter for immunization: Secondary | ICD-10-CM | POA: Diagnosis not present

## 2017-01-04 DIAGNOSIS — O360920 Maternal care for other rhesus isoimmunization, second trimester, not applicable or unspecified: Secondary | ICD-10-CM | POA: Diagnosis not present

## 2017-01-04 DIAGNOSIS — Z3480 Encounter for supervision of other normal pregnancy, unspecified trimester: Secondary | ICD-10-CM | POA: Diagnosis not present

## 2017-01-12 DIAGNOSIS — Z3483 Encounter for supervision of other normal pregnancy, third trimester: Secondary | ICD-10-CM | POA: Diagnosis not present

## 2017-01-12 DIAGNOSIS — Z3482 Encounter for supervision of other normal pregnancy, second trimester: Secondary | ICD-10-CM | POA: Diagnosis not present

## 2017-03-01 DIAGNOSIS — Z3685 Encounter for antenatal screening for Streptococcus B: Secondary | ICD-10-CM | POA: Diagnosis not present

## 2017-03-01 DIAGNOSIS — Z3480 Encounter for supervision of other normal pregnancy, unspecified trimester: Secondary | ICD-10-CM | POA: Diagnosis not present

## 2017-03-15 ENCOUNTER — Ambulatory Visit (INDEPENDENT_AMBULATORY_CARE_PROVIDER_SITE_OTHER): Payer: Self-pay | Admitting: Pediatrics

## 2017-03-15 DIAGNOSIS — Z7681 Expectant parent(s) prebirth pediatrician visit: Secondary | ICD-10-CM

## 2017-03-15 NOTE — Progress Notes (Signed)
Prenatal counseling for impending newborn done-- 1st boy, 72BMS, no complications, early prenatal care Z27.81

## 2017-03-21 ENCOUNTER — Inpatient Hospital Stay (HOSPITAL_COMMUNITY)
Admission: AD | Admit: 2017-03-21 | Discharge: 2017-03-23 | DRG: 775 | Disposition: A | Discharge status: Home / Self Care | Payer: BLUE CROSS/BLUE SHIELD | Source: Ambulatory Visit | Attending: Obstetrics and Gynecology | Admitting: Obstetrics and Gynecology

## 2017-03-21 ENCOUNTER — Inpatient Hospital Stay (HOSPITAL_COMMUNITY): Payer: BLUE CROSS/BLUE SHIELD | Admitting: Anesthesiology

## 2017-03-21 ENCOUNTER — Encounter (HOSPITAL_COMMUNITY): Payer: Self-pay

## 2017-03-21 DIAGNOSIS — Z3A38 38 weeks gestation of pregnancy: Secondary | ICD-10-CM | POA: Diagnosis not present

## 2017-03-21 DIAGNOSIS — Z412 Encounter for routine and ritual male circumcision: Secondary | ICD-10-CM | POA: Diagnosis not present

## 2017-03-21 DIAGNOSIS — Z23 Encounter for immunization: Secondary | ICD-10-CM | POA: Diagnosis not present

## 2017-03-21 DIAGNOSIS — O99824 Streptococcus B carrier state complicating childbirth: Secondary | ICD-10-CM | POA: Diagnosis not present

## 2017-03-21 DIAGNOSIS — Z3493 Encounter for supervision of normal pregnancy, unspecified, third trimester: Secondary | ICD-10-CM | POA: Diagnosis not present

## 2017-03-21 HISTORY — DX: Other specified health status: Z78.9

## 2017-03-21 LAB — CBC
HCT: 31.8 % — ABNORMAL LOW (ref 36.0–46.0)
Hemoglobin: 11.5 g/dL — ABNORMAL LOW (ref 12.0–15.0)
MCH: 32 pg (ref 26.0–34.0)
MCHC: 36.2 g/dL — AB (ref 30.0–36.0)
MCV: 88.6 fL (ref 78.0–100.0)
Platelets: 187 10*3/uL (ref 150–400)
RBC: 3.59 MIL/uL — ABNORMAL LOW (ref 3.87–5.11)
RDW: 13.5 % (ref 11.5–15.5)
WBC: 14 10*3/uL — ABNORMAL HIGH (ref 4.0–10.5)

## 2017-03-21 LAB — RPR: RPR Ser Ql: NONREACTIVE

## 2017-03-21 MED ORDER — COCONUT OIL OIL
1.0000 "application " | TOPICAL_OIL | Status: DC | PRN
  Administered 2017-03-21: 1 via TOPICAL
  Filled 2017-03-21: qty 120

## 2017-03-21 MED ORDER — IBUPROFEN 600 MG PO TABS
600.0000 mg | ORAL_TABLET | Freq: Four times a day (QID) | ORAL | Status: DC
  Administered 2017-03-21 – 2017-03-23 (×8): 600 mg via ORAL
  Filled 2017-03-21 (×8): qty 1

## 2017-03-21 MED ORDER — FENTANYL 2.5 MCG/ML BUPIVACAINE 1/10 % EPIDURAL INFUSION (WH - ANES)
14.0000 mL/h | INTRAMUSCULAR | Status: DC | PRN
  Administered 2017-03-21: 14 mL/h via EPIDURAL
  Filled 2017-03-21: qty 100

## 2017-03-21 MED ORDER — ACETAMINOPHEN 325 MG PO TABS: 650.0000 mg | ORAL_TABLET | ORAL | Status: DC | PRN

## 2017-03-21 MED ORDER — WITCH HAZEL-GLYCERIN EX PADS: 1.0000 "application " | MEDICATED_PAD | CUTANEOUS | Status: DC | PRN

## 2017-03-21 MED ORDER — DIPHENHYDRAMINE HCL 25 MG PO CAPS: 25.0000 mg | ORAL_CAPSULE | Freq: Four times a day (QID) | ORAL | Status: DC | PRN

## 2017-03-21 MED ORDER — OXYTOCIN 40 UNITS IN LACTATED RINGERS INFUSION - SIMPLE MED: 2.5000 [IU]/h | INTRAVENOUS | Status: DC

## 2017-03-21 MED ORDER — EPHEDRINE 5 MG/ML INJ
10.0000 mg | INTRAVENOUS | Status: DC | PRN
  Filled 2017-03-21: qty 2

## 2017-03-21 MED ORDER — ONDANSETRON HCL 4 MG/2ML IJ SOLN: 4.0000 mg | INTRAMUSCULAR | Status: DC | PRN

## 2017-03-21 MED ORDER — BENZOCAINE-MENTHOL 20-0.5 % EX AERO
1.0000 "application " | INHALATION_SPRAY | CUTANEOUS | Status: DC | PRN
  Administered 2017-03-21: 1 via TOPICAL
  Filled 2017-03-21: qty 56

## 2017-03-21 MED ORDER — OXYTOCIN 40 UNITS IN LACTATED RINGERS INFUSION - SIMPLE MED
INTRAVENOUS | Status: AC
  Filled 2017-03-21: qty 1000

## 2017-03-21 MED ORDER — LACTATED RINGERS IV SOLN
500.0000 mL | Freq: Once | INTRAVENOUS | Status: AC
  Administered 2017-03-21: 03:00:00 via INTRAVENOUS

## 2017-03-21 MED ORDER — LACTATED RINGERS IV SOLN
INTRAVENOUS | Status: DC
  Administered 2017-03-21: 03:00:00 via INTRAVENOUS

## 2017-03-21 MED ORDER — MEASLES, MUMPS & RUBELLA VAC ~~LOC~~ INJ: 0.5000 mL | INJECTION | Freq: Once | SUBCUTANEOUS | Status: DC

## 2017-03-21 MED ORDER — SOD CITRATE-CITRIC ACID 500-334 MG/5ML PO SOLN: 30.0000 mL | ORAL | Status: DC | PRN

## 2017-03-21 MED ORDER — ONDANSETRON HCL 4 MG/2ML IJ SOLN: 4.0000 mg | Freq: Four times a day (QID) | INTRAMUSCULAR | Status: DC | PRN

## 2017-03-21 MED ORDER — DIBUCAINE 1 % RE OINT: 1.0000 "application " | TOPICAL_OINTMENT | RECTAL | Status: DC | PRN

## 2017-03-21 MED ORDER — OXYTOCIN BOLUS FROM INFUSION: 500.0000 mL | Freq: Once | INTRAVENOUS | Status: DC

## 2017-03-21 MED ORDER — PRENATAL MULTIVITAMIN CH
1.0000 | ORAL_TABLET | Freq: Every day | ORAL | Status: DC
  Administered 2017-03-21 – 2017-03-22 (×2): 1 via ORAL
  Filled 2017-03-21 (×2): qty 1

## 2017-03-21 MED ORDER — LACTATED RINGERS IV SOLN: 500.0000 mL | INTRAVENOUS | Status: DC | PRN

## 2017-03-21 MED ORDER — LIDOCAINE HCL (PF) 1 % IJ SOLN
30.0000 mL | INTRAMUSCULAR | Status: DC | PRN
  Filled 2017-03-21: qty 30

## 2017-03-21 MED ORDER — LIDOCAINE HCL (PF) 1 % IJ SOLN
INTRAMUSCULAR | Status: DC | PRN
  Administered 2017-03-21: 6 mL via EPIDURAL
  Administered 2017-03-21: 4 mL

## 2017-03-21 MED ORDER — FLEET ENEMA 7-19 GM/118ML RE ENEM: 1.0000 | ENEMA | RECTAL | Status: DC | PRN

## 2017-03-21 MED ORDER — PHENYLEPHRINE 40 MCG/ML (10ML) SYRINGE FOR IV PUSH (FOR BLOOD PRESSURE SUPPORT)
80.0000 ug | PREFILLED_SYRINGE | INTRAVENOUS | Status: DC | PRN
  Filled 2017-03-21: qty 5

## 2017-03-21 MED ORDER — LIDOCAINE HCL (PF) 1 % IJ SOLN
INTRAMUSCULAR | Status: AC
  Filled 2017-03-21: qty 30

## 2017-03-21 MED ORDER — PHENYLEPHRINE 40 MCG/ML (10ML) SYRINGE FOR IV PUSH (FOR BLOOD PRESSURE SUPPORT)
80.0000 ug | PREFILLED_SYRINGE | INTRAVENOUS | Status: DC | PRN
  Filled 2017-03-21: qty 10
  Filled 2017-03-21: qty 5

## 2017-03-21 MED ORDER — SIMETHICONE 80 MG PO CHEW
80.0000 mg | CHEWABLE_TABLET | ORAL | Status: DC | PRN
Start: 2017-03-21 — End: 2017-03-23

## 2017-03-21 MED ORDER — ONDANSETRON HCL 4 MG PO TABS: 4.0000 mg | ORAL_TABLET | ORAL | Status: DC | PRN

## 2017-03-21 MED ORDER — DIPHENHYDRAMINE HCL 50 MG/ML IJ SOLN: 12.5000 mg | INTRAMUSCULAR | Status: DC | PRN

## 2017-03-21 MED ORDER — SENNOSIDES-DOCUSATE SODIUM 8.6-50 MG PO TABS
2.0000 | ORAL_TABLET | ORAL | Status: DC
  Administered 2017-03-21 – 2017-03-22 (×2): 2 via ORAL
  Filled 2017-03-21 (×2): qty 2

## 2017-03-21 MED ORDER — CLINDAMYCIN PHOSPHATE 900 MG/50ML IV SOLN
900.0000 mg | Freq: Three times a day (TID) | INTRAVENOUS | Status: DC
  Administered 2017-03-21: 900 mg via INTRAVENOUS
  Filled 2017-03-21 (×2): qty 50

## 2017-03-21 MED ORDER — TETANUS-DIPHTH-ACELL PERTUSSIS 5-2.5-18.5 LF-MCG/0.5 IM SUSP: 0.5000 mL | Freq: Once | INTRAMUSCULAR | Status: DC

## 2017-03-21 MED ORDER — OXYCODONE-ACETAMINOPHEN 5-325 MG PO TABS: 2.0000 | ORAL_TABLET | ORAL | Status: DC | PRN

## 2017-03-21 MED ORDER — OXYCODONE-ACETAMINOPHEN 5-325 MG PO TABS: 1.0000 | ORAL_TABLET | ORAL | Status: DC | PRN

## 2017-03-21 MED ORDER — MEDROXYPROGESTERONE ACETATE 150 MG/ML IM SUSP: 150.0000 mg | INTRAMUSCULAR | Status: DC | PRN

## 2017-03-21 NOTE — Anesthesia Preprocedure Evaluation (Signed)

## 2017-03-21 NOTE — H&P (Signed)
Laurie Gardner is a 31 y.o. female G1 @ 38+4 wks presenting for SOL.  Pregnancy uncomplicated.  OB History    Gravida Para Term Preterm AB Living   1             SAB TAB Ectopic Multiple Live Births                 Past Medical History:  Diagnosis Date  . Medical history non-contributory    Past Surgical History:  Procedure Laterality Date  . arthroscopic knee surgery     left knee  . PATELLAR TENDON REPAIR Left   . WISDOM TOOTH EXTRACTION     Family History: family history includes Cancer in her paternal grandmother; Hypertension in her mother; Melanoma in her maternal grandmother and mother; Migraines in her mother; Other in her paternal grandfather. Social History:  reports that she has never smoked. She has never used smokeless tobacco. She reports that she drinks alcohol. She reports that she does not use drugs.     Maternal Diabetes: No Genetic Screening: Normal Maternal Ultrasounds/Referrals: Normal Fetal Ultrasounds or other Referrals:  None Maternal Substance Abuse:  No Significant Maternal Medications:  None Significant Maternal Lab Results:  None Other Comments:  None  ROS History Dilation: 10 Effacement (%): 100 Station: +1, +2 Exam by:: Laurie Sarna Rothermel RN  Blood pressure 112/79, pulse (!) 103, temperature 98.6 F (37 C), temperature source Oral, resp. rate 18, height 5\' 9"  (1.753 m), weight 218 lb (98.9 kg), last menstrual period 07/05/2016, SpO2 99 %. Exam Physical Exam  Gen - NAD Abd - gravid, NT Ext - NT Cvx 5cm on admission Prenatal labs: ABO, Rh: --/--/B NEG (08/28 0250) Antibody: POS (08/28 0250) Rubella:   RPR:    HBsAg:    HIV:    GBS:   positive  Assessment/Plan: Admit Epidural prn [place] for GBS  Laurie Gardner 03/21/2017, 6:07 AM

## 2017-03-21 NOTE — Anesthesia Procedure Notes (Signed)
Epidural Patient location during procedure: OB  Staffing Anesthesiologist: Lyndle Herrlich  Preanesthetic Checklist Completed: patient identified, pre-op evaluation, timeout performed, IV checked, risks and benefits discussed and monitors and equipment checked  Epidural Patient position: sitting Prep: DuraPrep Patient monitoring: blood pressure and continuous pulse ox Approach: right paramedian Location: L3-L4 Injection technique: LOR air  Needle:  Needle type: Tuohy  Needle gauge: 17 G Needle insertion depth: 5 cm Catheter type: closed end flexible Catheter size: 19 Gauge Catheter at skin depth: 10 cm Test dose: negative  Assessment Sensory level: T8  Additional Notes    Dosing of Epidural:  1st dose, through catheter ............................................Marland Kitchen  Xylocaine 40 mg  2nd dose, through catheter, after waiting 3 minutes........Marland KitchenXylocaine 60 mg    As each dose occurred, patient was free of IV sx; and patient exhibited no evidence of SA injection.  Patient is more comfortable after epidural dosed. Please see RN's note for documentation of vital signs,and FHR which are stable.  Patient reminded not to try to ambulate with numb legs, and that an RN must be present when she attempts to get up.

## 2017-03-21 NOTE — Progress Notes (Signed)
SVD of vigorous female infant w/ apgars of 9,9.  Placenta delivered spontaneous w/ 3VC.   1st degree & bilat periurethral lac repaired w/ 3-0 vicryl rapide.  Fundus firm.  EBL 150cc .

## 2017-03-21 NOTE — MAU Note (Signed)
Pt here with c/o contractions most of the day, increasing in frequency and strength since about 2230. Denies any leaking of fluid. Has had some bloody show when wiping. GBS +

## 2017-03-21 NOTE — Anesthesia Postprocedure Evaluation (Signed)
Anesthesia Post Note  Patient: Laurie Gardner  Procedure(s) Performed: * No procedures listed *     Patient location during evaluation: Mother Baby Anesthesia Type: Epidural Level of consciousness: awake and alert and oriented Pain management: pain level controlled Vital Signs Assessment: post-procedure vital signs reviewed and stable Respiratory status: spontaneous breathing and nonlabored ventilation Cardiovascular status: stable Postop Assessment: no headache, patient able to bend at knees, no backache, no signs of nausea or vomiting, epidural receding and adequate PO intake Anesthetic complications: no    Last Vitals:  Vitals:   03/21/17 0805 03/21/17 0915  BP: 131/76 122/72  Pulse: 92 92  Resp: 18 18  Temp: 37.3 C 37 C  SpO2: 99%     Last Pain:  Vitals:   03/21/17 1129  TempSrc:   PainSc: 1    Pain Goal:                 [employer]

## 2017-03-21 NOTE — Lactation Note (Signed)
This note was copied from a baby's chart. Lactation Consultation Note: Infant is 97 hours old. Mother reports that infant has had one 10 mins feeding. She reports she felt slight pinching on her nipple. Mother took [facility] breastfeeding classes. She reports remembering a lot from class. Basic breastfeeding done. Infant placed skin to skin with mother in football hold. Infant very sleepy and not cuing. Wake up exercises attempted and infant rouse for a few sucks. Mother has areola edema. She was taught to do reverse pressure exercise and to firm nipple before attempting latch. Mother taught hand expression and observed a few clear drops of colostrum. Mother advised to do frequent skin to skin and allow infant to nuzzle frequently. Mother to offer breast with all feeding cues and to feed at least 8-12 times. Mother to page [facility] or staff nurse to check latch and assist with next feeding. Mother was given Lactation brochure with information on all services.   Patient Name: Laurie Gardner Today's Date: 03/21/2017 Reason for consult: Initial assessment   Maternal Data Has patient been taught Hand Expression?: Yes Does the patient have breastfeeding experience prior to this delivery?: No  Feeding Feeding Type: Breast Fed  LATCH Score Latch: Repeated attempts needed to sustain latch, nipple held in mouth throughout feeding, stimulation needed to elicit sucking reflex.  Audible Swallowing: None  Type of Nipple: Everted at rest and after stimulation (areola edema)  Comfort (Breast/Nipple): Soft / non-tender  Hold (Positioning): Assistance needed to correctly position infant at breast and maintain latch.  LATCH Score: 6  Interventions Interventions: Breast feeding basics reviewed;Assisted with latch;Skin to skin;Breast massage;Hand express;Support pillows;Position options;Expressed milk  Lactation Tools Discussed/Used WIC Program: Yes   Consult Status Consult Status: Follow-up Date:  03/22/17 Follow-up type: In-patient    Jess Barters [facility] 03/21/2017, 12:58 PM

## 2017-03-22 LAB — CBC
HCT: 34 % — ABNORMAL LOW (ref 36.0–46.0)
Hemoglobin: 11.9 g/dL — ABNORMAL LOW (ref 12.0–15.0)
MCH: 31.9 pg (ref 26.0–34.0)
MCHC: 35 g/dL (ref 30.0–36.0)
MCV: 91.2 fL (ref 78.0–100.0)
PLATELETS: 217 10*3/uL (ref 150–400)
RBC: 3.73 MIL/uL — AB (ref 3.87–5.11)
RDW: 14 % (ref 11.5–15.5)
WBC: 12 10*3/uL — ABNORMAL HIGH (ref 4.0–10.5)

## 2017-03-22 LAB — BIRTH TISSUE RECOVERY COLLECTION (PLACENTA DONATION)

## 2017-03-22 MED ORDER — RHO D IMMUNE GLOBULIN 1500 UNIT/2ML IJ SOSY
300.0000 ug | PREFILLED_SYRINGE | Freq: Once | INTRAMUSCULAR | Status: AC
  Administered 2017-03-22: 300 ug via INTRAVENOUS
  Filled 2017-03-22: qty 2

## 2017-03-22 NOTE — Progress Notes (Signed)
Post Partum Day 1 Subjective: no complaints, up ad lib, voiding and tolerating PO  Objective: Blood pressure 123/75, pulse 75, temperature 98.1 F (36.7 C), temperature source Oral, resp. rate 16, height 5\' 9"  (1.753 m), weight 218 lb (98.9 kg), last menstrual period 07/05/2016, SpO2 99 %, unknown if currently breastfeeding.  Physical Exam:  General: alert, cooperative, appears stated age and no distress Lochia: appropriate Uterine Fundus: firm Incision: healing well DVT Evaluation: No evidence of DVT seen on physical exam.   Recent Labs  03/21/17 0250 03/22/17 0541  HGB 11.5* 11.9*  HCT 31.8* 34.0*    Assessment/Plan: Plan for discharge tomorrow and Circumcision prior to discharge   LOS: 1 day   Laurie Gardner C 03/22/2017, 9:24 AM

## 2017-03-22 NOTE — Lactation Note (Signed)
This note was copied from a baby's chart. Lactation Consultation Note  Patient Name: Laurie Gardner Date: 03/22/2017 Reason for consult: Follow-up assessment;Infant weight loss;Difficult latch;Nipple pain/trauma  Baby is 79 1/2 hours old , and per mom doesn't feel the baby is latching well or getting enough,  Also both nipples are sore.  LC with moms permission assessed breast tissue and noted both to have positional strips.  Semi compressible tough areolas indicating need for NS and pumping.  @ consult , reviewed hand expressing, and reverse pressure , use of the hand pump.  Sized for NS and noted the #20 NS to be a shallow fit and the #24 NS to be a comfortable fit.  LC showed mom how to apply the NS #24 and instill the formula into the top. ( when EBM is available  Will use it). Baby awake, and opened wide and latched , flipped upper lip to flange position and eased down the chin.  / football position, and baby fed for 10 mins and released, no colostrum noted in the NS , baby did  Receive an appetizer in the NS ,  LC changed a wet diaper, and re-latched instilled formula and baby latched and still feeding. With a consistent swallows pattern.    Lactation Plan :  Breast shells between feedings  And when not using the shells for sleep - comfort gel.  Steps for latching - breast massage , hand express, pre-pump , and reverse pressure ,  Apply NS , instil. EBM / formula  Feed the baby 15 -20 mins , and offer 2nd breast  Supplement afterwards with EBM 1st , formula 2nd ,  Post pump after every feeding 15 -20 mins , save milk for next feeding.     Maternal Data Has patient been taught Hand Expression?: Yes  Feeding Feeding Type: Breast Fed Length of feed:  (with NS, and formula inside )  LATCH Score Latch: Grasps breast easily, tongue down, lips flanged, rhythmical sucking.  Audible Swallowing: A few with stimulation  Type of Nipple: Flat (semi compresssible areolas  )  Comfort (Breast/Nipple): Soft / non-tender  Hold (Positioning): Assistance needed to correctly position infant at breast and maintain latch.  LATCH Score: 7  Interventions Interventions: Breast feeding basics reviewed;Assisted with latch;Skin to skin;Breast massage;Hand express  Lactation Tools Discussed/Used Tools: Shells;Pump (all tools have been given to mom by [facility] , has not used shells yet ) Shell Type: Inverted Breast pump type: Double-Electric Breast Pump;Manual (per mom pumped with DEBP x1 ) Pump Review: Setup, frequency, and cleaning Initiated by:: by RN / reviewed by [facility]  Date initiated:: 03/22/17   Consult Status Consult Status: Follow-up Date: 03/23/17 Follow-up type: In-patient    [place] 03/22/2017, 3:59 PM

## 2017-03-23 LAB — RH IG WORKUP (INCLUDES ABO/RH)
ABO/RH(D): B NEG
Fetal Screen: NEGATIVE
GESTATIONAL AGE(WKS): 38
Unit division: 0

## 2017-03-23 MED ORDER — IBUPROFEN 600 MG PO TABS: 600.0000 mg | ORAL_TABLET | Freq: Four times a day (QID) | ORAL | 0 refills | Status: DC

## 2017-03-23 NOTE — Discharge Summary (Signed)
Obstetric Discharge Summary Reason for Admission: onset of labor Prenatal Procedures: none Intrapartum Procedures: spontaneous vaginal delivery Postpartum Procedures: none Complications-Operative and Postpartum: 1st degree perineal laceration and bilateral periurethral laceration Hemoglobin  Date Value Ref Range Status  03/22/2017 11.9 (L) 12.0 - 15.0 g/dL Final   HCT  Date Value Ref Range Status  03/22/2017 34.0 (L) 36.0 - 46.0 % Final    Physical Exam:  General: alert, cooperative and appears stated age 31: appropriate Uterine Fundus: firm Incision: healing well, no significant drainage, no dehiscence DVT Evaluation: No evidence of DVT seen on physical exam. Negative Homan's sign. No cords or calf tenderness.  Discharge Diagnoses: Term Pregnancy-delivered  Discharge Information: Date: 03/23/2017 Activity: pelvic rest Diet: routine Medications: PNV and Ibuprofen Condition: stable Instructions: refer to practice specific booklet Discharge to: home   Newborn Data: Live born female  Birth Weight: 6 lb 7.2 oz (2926 g) APGAR: 9, 9  Home with mother.  Sole Lengacher, [place] 03/23/2017, 8:54 AM

## 2017-03-23 NOTE — Discharge Instructions (Signed)
Call MD for T>100.4, heavy vaginal bleeding, severe abdominal pain, or respiratory distress.  Call office to schedule postpartum visit in 6 weeks.  Pelvic rest x 6 weeks.   

## 2017-03-23 NOTE — Lactation Note (Signed)
This note was copied from a baby's chart. Lactation Consultation Note  Patient Name: Laurie Gardner YFVCB'[id number] Date: 03/23/2017 Reason for consult: Follow-up assessment;Difficult latch  Baby 64 hours old. Mom reports that baby is able to latch to left breast without NS, but needs shield on right breast. Mom has just finished nursing baby on right breast with #24 NS, but no colostrum present in shield. Mom states that she was able to use curve-tipped syringe and baby took 10 ml of formula. Assisted mom to use 5 [place] feeding system and baby took an additional 10 ml at right breast. Discussed increasing baby's supplementation amounts and how to know if baby getting enough at breast. Mom reports that she has used DEBP once, and now her breast feel engorged. Assisted mom to ice, massage and use manual pump and milk is still flowing. Reviewed engorgement prevention/treatment and milk coming to volume. Mom reports that she has a DEBP at home. Discussed the need to continue pump as long as she is using NS.  Plan is for mom to put baby to breast with cues and at least every 3 hours d/t baby weighing less than 6 pounds. Enc parents to supplement baby with EBM/formula and reviewed amounts. Enc mom to pump after each feeding. Mom given SNS, has NS and comfort gels as well. Mom aware of OP/BFSG and [place] phone line assistance after D/C. Enc mom to call for assistance as needed.   Maternal Data    Feeding Feeding Type: Formula Length of feed: 10 min  LATCH Score                   Interventions    Lactation Tools Discussed/Used Tools: Nipple Shields;42F feeding tube / Syringe Nipple shield size: 24   Consult Status Consult Status: PRN    Andres Labrum 03/23/2017, 11:04 AM

## 2017-03-25 LAB — TYPE AND SCREEN
ABO/RH(D): B NEG
ANTIBODY SCREEN: POSITIVE
DAT, IGG: NEGATIVE
Unit division: 0
Unit division: 0

## 2017-03-25 LAB — BPAM RBC
Blood Product Expiration Date: 201809252359
Blood Product Expiration Date: 201809272359
Unit Type and Rh: 9500
Unit Type and Rh: 9500

## 2017-03-31 ENCOUNTER — Encounter: Payer: Self-pay | Admitting: Physician Assistant

## 2017-05-02 DIAGNOSIS — Z1389 Encounter for screening for other disorder: Secondary | ICD-10-CM | POA: Diagnosis not present

## 2017-07-05 ENCOUNTER — Encounter: Payer: Self-pay | Admitting: Physician Assistant

## 2018-01-02 ENCOUNTER — Other Ambulatory Visit: Payer: Self-pay

## 2018-01-02 ENCOUNTER — Ambulatory Visit (INDEPENDENT_AMBULATORY_CARE_PROVIDER_SITE_OTHER): Payer: BLUE CROSS/BLUE SHIELD | Admitting: Physician Assistant

## 2018-01-02 ENCOUNTER — Encounter: Payer: Self-pay | Admitting: Physician Assistant

## 2018-01-02 VITALS — BP 110/70 | HR 115 | Temp 98.6°F | Resp 18 | Ht 69.29 in | Wt 195.2 lb

## 2018-01-02 DIAGNOSIS — Z23 Encounter for immunization: Secondary | ICD-10-CM | POA: Diagnosis not present

## 2018-01-02 DIAGNOSIS — K5900 Constipation, unspecified: Secondary | ICD-10-CM | POA: Diagnosis not present

## 2018-01-02 DIAGNOSIS — Z13228 Encounter for screening for other metabolic disorders: Secondary | ICD-10-CM

## 2018-01-02 DIAGNOSIS — E786 Lipoprotein deficiency: Secondary | ICD-10-CM

## 2018-01-02 DIAGNOSIS — Z1389 Encounter for screening for other disorder: Secondary | ICD-10-CM

## 2018-01-02 DIAGNOSIS — Z0000 Encounter for general adult medical examination without abnormal findings: Secondary | ICD-10-CM

## 2018-01-02 DIAGNOSIS — R12 Heartburn: Secondary | ICD-10-CM

## 2018-01-02 DIAGNOSIS — Z130 Encounter for screening for diseases of the blood and blood-forming organs and certain disorders involving the immune mechanism: Secondary | ICD-10-CM

## 2018-01-02 DIAGNOSIS — Z1329 Encounter for screening for other suspected endocrine disorder: Secondary | ICD-10-CM

## 2018-01-02 MED ORDER — RANITIDINE HCL 150 MG PO TABS
150.0000 mg | ORAL_TABLET | Freq: Two times a day (BID) | ORAL | 0 refills | Status: DC
Start: 2018-01-02 — End: 2019-11-25

## 2018-01-02 MED ORDER — POLYETHYLENE GLYCOL 3350 17 GM/SCOOP PO POWD: 17.0000 g | Freq: Every day | ORAL | 1 refills | Status: DC

## 2018-01-02 NOTE — Progress Notes (Signed)
Laurie Gardner  MRN: [id number] DOB: May 03, 1986  PCP: Mancel Bale, PA-C   Chief Complaint  Patient presents with  . Annual Exam    no pap  . Abdominal Pain    having to use the bathroom every morning and sometimes vomiting     Subjective:  Pt presents to clinic for a CPE.  She is also having daily am trouble with abdominal pain and trouble having a BM. Her stools are hard and many times resemble balls.  While she is having her bowel movement or the morning she gets nauseated and throws up.  The emesis is better and sour tasting better she has been doing this for years.  She is 9 months postpartum and she has not really noticed an improvement or worsening since delivery of her son.  She is really tried nothing for this.  She does have heartburn at times.  She has not noticed whether this is related to late eating or what she is eating at night.  She has not related her heartburn to morning vomiting.  Last dental exam: every year Last vision exam: never Last pap: 6 months ago Vaccinations -up-to-date  Typical meals for patient: lots of breads and pasta - low amount of veggies Typical beverage choices: water (5 - 16oz bottle) Exercises: 2 times per week for 30 minutes Sleeps: 6 hrs per night and sleeping well   There are no active problems to display for this patient.   Patient Care Team: Mittie Bodo as PCP - General (Physician Assistant) Marylynn Pearson, MD as Consulting Physician (Obstetrics and Gynecology)  Review of Systems  Constitutional: Negative.   HENT: Negative.   Eyes: Negative.   Respiratory: Negative.   Cardiovascular: Negative.   Gastrointestinal: Positive for constipation, nausea and vomiting (in the am sometimes - acidic in flavor).  Endocrine: Negative.   Genitourinary: Negative.   Musculoskeletal: Negative.   Skin: Negative.   Allergic/Immunologic: Negative.   Neurological: Negative.   Hematological: Negative.   Psychiatric/Behavioral:  Negative.      Current Outpatient Medications on File Prior to Visit  Medication Sig Dispense Refill  . Prenatal Vit-Fe Fumarate-FA (PRENATAL MULTIVITAMIN) TABS tablet Take 1 tablet by mouth at bedtime.      No current facility-administered medications on file prior to visit.     Allergies  Allergen Reactions  . Penicillins Rash    Has patient had a PCN reaction causing immediate rash, facial/tongue/throat swelling, SOB or lightheadedness with hypotension: No Has patient had a PCN reaction causing severe rash involving mucus membranes or skin necrosis: No Has patient had a PCN reaction that required hospitalization: No Has patient had a PCN reaction occurring within the last 10 years: No If all of the above answers are "NO", then may proceed with Cephalosporin use.     Social History   Socioeconomic History  . Marital status: Married    Spouse name: Not on file  . Number of children: Not on file  . Years of education: Not on file  . Highest education level: Not on file  Occupational History  . Occupation: Nurse, adult: [employer]  . Financial resource strain: Not on file  . Food insecurity:    Worry: Not on file    Inability: Not on file  . Transportation needs:    Medical: Not on file    Non-medical: Not on file  Tobacco Use  . Smoking status: Never Smoker  .  Smokeless tobacco: Never Used  Substance and Sexual Activity  . Alcohol use: Yes    Comment: not often; not while preg  . Drug use: No  . Sexual activity: Yes    Partners: Male  Lifestyle  . Physical activity:    Days per week: Not on file    Minutes per session: Not on file  . Stress: Not on file  Relationships  . Social connections:    Talks on phone: Not on file    Gets together: Not on file    Attends religious service: Not on file    Active member of club or organization: Not on file    Attends meetings of clubs or organizations: Not on file     Relationship status: Not on file  Other Topics Concern  . Not on file  Social History Narrative   Single, engaged. Education: [employer].    Exercise: Yes   Works at [employer] 100%   Guns in home - no    Past Surgical History:  Procedure Laterality Date  . arthroscopic knee surgery     left knee  . PATELLAR TENDON REPAIR Left   . WISDOM TOOTH EXTRACTION      Family History  Problem Relation Age of Onset  . Melanoma Mother   . Migraines Mother   . Hypertension Mother   . Melanoma Maternal Grandmother   . Cancer Paternal Grandmother   . Other Paternal Grandfather      Objective:  BP 110/70   Pulse (!) 115   Temp 98.6 F (37 C) (Oral)   Resp 18   Ht 5' 9.29" (1.76 m)   Wt 195 lb 3.2 oz (88.5 kg)   LMP 12/25/2017   SpO2 99%   BMI 28.58 kg/m   Physical Exam  Constitutional: She is oriented to person, place, and time. She appears well-developed and well-nourished.  HENT:  Head: Normocephalic and atraumatic.  Right Ear: Hearing, tympanic membrane, external ear and ear canal normal.  Left Ear: Hearing, tympanic membrane, external ear and ear canal normal.  Nose: Nose normal.  Mouth/Throat: Uvula is midline, oropharynx is clear and moist and mucous membranes are normal.  Eyes: Pupils are equal, round, and reactive to light. Conjunctivae, EOM and lids are normal. Right eye exhibits no discharge. Left eye exhibits no discharge.  Neck: Trachea normal and normal range of motion. Neck supple. No thyroid mass and no thyromegaly present.  Cardiovascular: Normal rate, regular rhythm and normal heart sounds.  No murmur heard. Pulmonary/Chest: Effort normal and breath sounds normal. She has no wheezes.  Abdominal: Soft. Normal appearance and bowel sounds are normal. There is tenderness (lower abd TTP, mild TTP epigastric - more like pressure) in the right lower quadrant, epigastric area, suprapubic area and left lower quadrant.    Musculoskeletal: Normal range of motion.  Lymphadenopathy:       Head (right side): No tonsillar, no preauricular, no posterior auricular and no occipital adenopathy present.       Head (left side): No tonsillar, no preauricular, no posterior auricular and no occipital adenopathy present.    She has no cervical adenopathy.       Right: No supraclavicular adenopathy present.       Left: No supraclavicular adenopathy present.  Neurological: She is alert and oriented to person, place, and time. She has normal strength and normal reflexes.  Skin: Skin is warm, dry and intact.  Psychiatric: She has  a normal mood and affect. Her speech is normal and behavior is normal. Judgment and thought content normal.  Vitals reviewed.   Wt Readings from Last 3 Encounters:  01/02/18 195 lb 3.2 oz (88.5 kg)  03/21/17 218 lb (98.9 kg)  12/06/16 201 lb (91.2 kg)     Visual Acuity Screening   Right eye Left eye Both eyes  Without correction: '20/20 20/20 20/15 '  With correction:       Assessment and Plan :  Annual physical exam -anticipatory guidance given  Screening for metabolic disorder - Plan: CMP14+EGFR -check labs  Screening for deficiency anemia - Plan: CBC with Differential/Platelet -check labs  Screening for blood or protein in urine - Plan: Urinalysis, dipstick only check labs -   Low HDL (under 40) - Plan: Lipid panel -check labs, patient is not fasting she ate 2 hours ago.  Screening for thyroid disorder - Plan: TSH check labs -   Need for diphtheria-tetanus-pertussis (Tdap) vaccine - Plan: Tdap vaccine greater than or equal to 7yo IM -updated today  Heartburn - Plan: ranitidine (ZANTAC) 150 MG tablet -wonder if a.m. vomiting and nausea is related to increased acid production from it, will start with Zantac twice a day.  She will be aware of what she eats at night both time and to see what her morning symptoms are.  We may have to try PPI if this does not work.  Constipation,  unspecified constipation type - Plan: polyethylene glycol powder (GLYCOLAX/MIRALAX) powder -suspect this is the reason for her abdominal pain question whether this is also related to her nausea in the morning but doubt it.  She will start with a few softener such as Colace and add MiraLAX daily until she has soft stool if it becomes diarrhea she will go down to half a dose a day.  She will recheck in 1 month for her abdominal pain and vomiting symptoms.  Windell Hummingbird PA-C  [facility] 01/02/2018 4:53 PM

## 2018-01-02 NOTE — Patient Instructions (Addendum)
For constipation   Magnesium  - Relaxing Calm - to help with constipation  Make sure you are drinking enough water daily. Make sure you are getting enough fiber in your diet - this will make you regular - you can eat high fiber foods or use metamucil as a supplement - it is really important to drink enough water when using fiber supplements.  If your stools are hard or are formed balls or you have to strain a stool softener will help - use colace 2-3 capsule a day  For gentle treatment of constipation Use Miralax 1-2 capfuls a day until your stools are soft and regular and then decrease the usage - you can use this daily until I see you next.  For vomiting in the am Start with Zantac 150mg  2x/day       IF you received an x-ray today, you will receive an invoice from [facility]. Please contact [facility] at [phone] with questions or concerns regarding your invoice.   IF you received labwork today, you will receive an invoice from [facility]. Please contact [facility] at [phone] with questions or concerns regarding your invoice.   Our billing staff will not be able to assist you with questions regarding bills from these companies.  You will be contacted with the lab results as soon as they are available. The fastest way to get your results is to activate your My Chart account. Instructions are located on the last page of this paperwork. If you have not heard from [place] regarding the results in 2 weeks, please contact this office.

## 2018-01-03 LAB — URINALYSIS, DIPSTICK ONLY
Bilirubin, UA: NEGATIVE
Glucose, UA: NEGATIVE
Ketones, UA: NEGATIVE
Leukocytes, UA: NEGATIVE
Nitrite, UA: NEGATIVE
Protein, UA: NEGATIVE
RBC, UA: NEGATIVE
Specific Gravity, UA: 1.022 (ref 1.005–1.030)
Urobilinogen, Ur: 0.2 mg/dL (ref 0.2–1.0)
pH, UA: 5.5 (ref 5.0–7.5)

## 2018-01-03 LAB — CMP14+EGFR
ALT: 14 IU/L (ref 0–32)
AST: 14 IU/L (ref 0–40)
Albumin/Globulin Ratio: 1.8 (ref 1.2–2.2)
Albumin: 4.6 g/dL (ref 3.5–5.5)
Alkaline Phosphatase: 69 IU/L (ref 39–117)
BUN/Creatinine Ratio: 21 (ref 9–23)
BUN: 13 mg/dL (ref 6–20)
Bilirubin Total: 0.3 mg/dL (ref 0.0–1.2)
CO2: 21 mmol/L (ref 20–29)
Calcium: 9.6 mg/dL (ref 8.7–10.2)
Chloride: 102 mmol/L (ref 96–106)
Creatinine, Ser: 0.61 mg/dL (ref 0.57–1.00)
GFR calc Af Amer: 142 mL/min/1.73 (ref >59)
GFR calc non Af Amer: 123 mL/min/1.73 (ref >59)
Globulin, Total: 2.6 g/dL (ref 1.5–4.5)
Glucose: 91 mg/dL (ref 65–99)
Potassium: 4.2 mmol/L (ref 3.5–5.2)
Sodium: 139 mmol/L (ref 134–144)
Total Protein: 7.2 g/dL (ref 6.0–8.5)

## 2018-01-03 LAB — CBC WITH DIFFERENTIAL/PLATELET
BASOS ABS: 0 10*3/uL (ref 0.0–0.2)
Basos: 0 %
EOS (ABSOLUTE): 0.1 10*3/uL (ref 0.0–0.4)
Eos: 1 %
Hematocrit: 40.8 % (ref 34.0–46.6)
Hemoglobin: 14.1 g/dL (ref 11.1–15.9)
Immature Grans (Abs): 0 10*3/uL (ref 0.0–0.1)
Immature Granulocytes: 0 %
LYMPHS ABS: 1.8 10*3/uL (ref 0.7–3.1)
Lymphs: 23 %
MCH: 30.7 pg (ref 26.6–33.0)
MCHC: 34.6 g/dL (ref 31.5–35.7)
MCV: 89 fL (ref 79–97)
MONOCYTES: 5 %
MONOS ABS: 0.4 10*3/uL (ref 0.1–0.9)
Neutrophils Absolute: 5.4 10*3/uL (ref 1.4–7.0)
Neutrophils: 71 %
PLATELETS: 235 10*3/uL (ref 150–450)
RBC: 4.59 x10E6/uL (ref 3.77–5.28)
RDW: 13.9 % (ref 12.3–15.4)
WBC: 7.7 10*3/uL (ref 3.4–10.8)

## 2018-01-03 LAB — LIPID PANEL
CHOLESTEROL TOTAL: 173 mg/dL (ref 100–199)
Chol/HDL Ratio: 5.6 ratio — ABNORMAL HIGH (ref 0.0–4.4)
HDL: 31 mg/dL — ABNORMAL LOW (ref >39)
Triglycerides: 432 mg/dL — ABNORMAL HIGH (ref 0–149)

## 2018-01-03 LAB — TSH: TSH: 0.813 u[IU]/mL (ref 0.450–4.500)

## 2018-02-06 DIAGNOSIS — R82998 Other abnormal findings in urine: Secondary | ICD-10-CM | POA: Diagnosis not present

## 2018-02-06 DIAGNOSIS — R1032 Left lower quadrant pain: Secondary | ICD-10-CM | POA: Diagnosis not present

## 2018-02-06 DIAGNOSIS — R102 Pelvic and perineal pain: Secondary | ICD-10-CM | POA: Diagnosis not present

## 2018-05-22 DIAGNOSIS — Z01419 Encounter for gynecological examination (general) (routine) without abnormal findings: Secondary | ICD-10-CM | POA: Diagnosis not present

## 2018-05-22 DIAGNOSIS — Z6828 Body mass index (BMI) 28.0-28.9, adult: Secondary | ICD-10-CM | POA: Diagnosis not present

## 2018-12-11 ENCOUNTER — Encounter: Payer: Self-pay | Admitting: Family Medicine

## 2019-01-02 ENCOUNTER — Other Ambulatory Visit: Payer: Self-pay

## 2019-01-02 ENCOUNTER — Encounter: Payer: Self-pay | Admitting: Family Medicine

## 2019-01-02 ENCOUNTER — Ambulatory Visit (INDEPENDENT_AMBULATORY_CARE_PROVIDER_SITE_OTHER): Payer: BC Managed Care – PPO | Admitting: Family Medicine

## 2019-01-02 VITALS — BP 125/86 | HR 77 | Temp 98.4°F | Resp 16 | Ht 69.0 in | Wt 186.0 lb

## 2019-01-02 DIAGNOSIS — Z131 Encounter for screening for diabetes mellitus: Secondary | ICD-10-CM

## 2019-01-02 DIAGNOSIS — Z0000 Encounter for general adult medical examination without abnormal findings: Secondary | ICD-10-CM

## 2019-01-02 DIAGNOSIS — Z13220 Encounter for screening for lipoid disorders: Secondary | ICD-10-CM | POA: Diagnosis not present

## 2019-01-02 DIAGNOSIS — Z0001 Encounter for general adult medical examination with abnormal findings: Secondary | ICD-10-CM | POA: Diagnosis not present

## 2019-01-02 DIAGNOSIS — Z1389 Encounter for screening for other disorder: Secondary | ICD-10-CM

## 2019-01-02 DIAGNOSIS — L309 Dermatitis, unspecified: Secondary | ICD-10-CM

## 2019-01-02 DIAGNOSIS — N920 Excessive and frequent menstruation with regular cycle: Secondary | ICD-10-CM

## 2019-01-02 DIAGNOSIS — Z136 Encounter for screening for cardiovascular disorders: Secondary | ICD-10-CM

## 2019-01-02 LAB — POCT URINALYSIS DIP (MANUAL ENTRY)
Bilirubin, UA: NEGATIVE
Blood, UA: NEGATIVE
Glucose, UA: NEGATIVE mg/dL
Ketones, POC UA: NEGATIVE mg/dL
Leukocytes, UA: NEGATIVE
Nitrite, UA: NEGATIVE
Protein Ur, POC: NEGATIVE mg/dL
Spec Grav, UA: 1.025 (ref 1.010–1.025)
Urobilinogen, UA: 0.2 E.U./dL
pH, UA: 6 (ref 5.0–8.0)

## 2019-01-02 MED ORDER — HYDROCORTISONE 2.5 % EX CREA: TOPICAL_CREAM | Freq: Two times a day (BID) | CUTANEOUS | 0 refills | Status: DC

## 2019-01-02 NOTE — Progress Notes (Signed)
Chief Complaint  Patient presents with  . Annual Exam    no pap    Subjective:  Laurie Gardner is a 33 y.o. female here for a health maintenance visit.  Patient is established pt  There are no active problems to display for this patient.   Past Medical History:  Diagnosis Date  . Medical history non-contributory     Past Surgical History:  Procedure Laterality Date  . arthroscopic knee surgery     left knee  . PATELLAR TENDON REPAIR Left   . WISDOM TOOTH EXTRACTION       Outpatient Medications Prior to Visit  Medication Sig Dispense Refill  . polyethylene glycol powder (GLYCOLAX/MIRALAX) powder Take 17 g by mouth daily. 500 g 1  . Prenatal Vit-Fe Fumarate-FA (PRENATAL MULTIVITAMIN) TABS tablet Take 1 tablet by mouth at bedtime.     . ranitidine (ZANTAC) 150 MG tablet Take 1 tablet (150 mg total) by mouth 2 (two) times daily. (Patient not taking: Reported on 01/02/2019) 60 tablet 0   No facility-administered medications prior to visit.     Allergies  Allergen Reactions  . Penicillins Rash    Has patient had a PCN reaction causing immediate rash, facial/tongue/throat swelling, SOB or lightheadedness with hypotension: No Has patient had a PCN reaction causing severe rash involving mucus membranes or skin necrosis: No Has patient had a PCN reaction that required hospitalization: No Has patient had a PCN reaction occurring within the last 10 years: No If all of the above answers are "NO", then may proceed with Cephalosporin use.      Family History  Problem Relation Age of Onset  . Melanoma Mother   . Migraines Mother   . Hypertension Mother   . Melanoma Maternal Grandmother   . Cancer Paternal Grandmother   . Other Paternal Grandfather      Health Habits: Dental Exam: up to date Eye Exam: not up to date Exercise: 3 times/week on average Current exercise activities: walking/running Diet: balanced   Social History   Socioeconomic History  . Marital status:  Married    Spouse name: Not on file  . Number of children: Not on file  . Years of education: Not on file  . Highest education level: Not on file  Occupational History  . Occupation: Nurse, adult: [employer]  . Financial resource strain: Not on file  . Food insecurity:    Worry: Not on file    Inability: Not on file  . Transportation needs:    Medical: Not on file    Non-medical: Not on file  Tobacco Use  . Smoking status: Never Smoker  . Smokeless tobacco: Never Used  Substance and Sexual Activity  . Alcohol use: Yes    Comment: not often; not while preg  . Drug use: No  . Sexual activity: Yes    Partners: Male  Lifestyle  . Physical activity:    Days per week: Not on file    Minutes per session: Not on file  . Stress: Not on file  Relationships  . Social connections:    Talks on phone: Not on file    Gets together: Not on file    Attends religious service: Not on file    Active member of club or organization: Not on file    Attends meetings of clubs or organizations: Not on file    Relationship status: Not on file  . Intimate partner violence:  Fear of current or ex partner: Not on file    Emotionally abused: Not on file    Physically abused: Not on file    Forced sexual activity: Not on file  Other Topics Concern  . Not on file  Social History Narrative   Single, engaged. Education: [employer].    Exercise: Yes   Works at [employer] 100%   Guns in home - no   Social History   Substance and Sexual Activity  Alcohol Use Yes   Comment: not often; not while preg   Social History   Tobacco Use  Smoking Status Never Smoker  Smokeless Tobacco Never Used   Social History   Substance and Sexual Activity  Drug Use No    GYN: Sexual Health Menstrual status: regular menses LMP: Patient's last menstrual period was 12/07/2018. Last pap smear: see HM section History of  abnormal pap smears:  Sexually active: with female partner Current contraception: condoms  Health Maintenance: See under health Maintenance activity for review of completion dates as well. Immunization History  Administered Date(s) Administered  . DTaP 09/16/1988, 11/18/1988, 01/27/1989, 09/09/1992, 02/01/1993  . HPV Quadrivalent 04/26/2006, 06/27/2006, 10/30/2006  . Hepatitis A 12/05/2003, 01/04/2007  . Hepatitis B 04/27/2000, 06/02/2000, 11/02/2000  . HiB (PRP-OMP) 01/05/1990  . IPV 09/16/1988, 11/18/1988, 01/05/1990, 09/09/1992  . Influenza Whole 03/25/2013  . Influenza-Unspecified 05/12/2014, 07/05/2017  . MMR 01/05/1990, 09/09/1992  . Meningococcal Conjugate 01/11/2007  . Td 12/05/2003, 01/04/2007  . Tdap 01/04/2007, 01/02/2018      Depression Screen-PHQ2/9 Depression screen [facility] 2/9 01/02/2019 01/02/2018 12/06/2016 12/25/2015 02/11/2015  Decreased Interest 0 0 0 0 0  Down, Depressed, Hopeless 0 0 0 0 0  PHQ - 2 Score 0 0 0 0 0       Depression Severity and Treatment Recommendations:  0-4= None  5-9= Mild / Treatment: Support, educate to call if worse; return in one month  10-14= Moderate / Treatment: Support, watchful waiting; Antidepressant or Psycotherapy  15-19= Moderately severe / Treatment: Antidepressant OR Psychotherapy  >= 20 = Major depression, severe / Antidepressant AND Psychotherapy    Review of Systems   ROS  See HPI for ROS as well.   Review of Systems  Constitutional: Negative for activity change, appetite change, chills and fever.  HENT: Negative for congestion, nosebleeds, trouble swallowing and voice change.   Respiratory: Negative for cough, shortness of breath and wheezing.   Gastrointestinal: Negative for diarrhea, nausea and vomiting.  Genitourinary: Negative for difficulty urinating, dysuria, flank pain and hematuria.  Musculoskeletal: Negative for back pain, joint swelling and neck pain.  Neurological: Negative for dizziness, speech  difficulty, light-headedness and numbness.  See HPI. All other review of systems negative.     Objective:   Vitals:   01/02/19 1319  BP: 125/86  Pulse: 77  Resp: 16  Temp: 98.4 F (36.9 C)  TempSrc: Oral  SpO2: 99%  Weight: 186 lb (84.4 kg)  Height: '5\' 9"'  (1.753 m)    Body mass index is 27.47 kg/m.  Physical Exam Constitutional:      Appearance: Normal appearance.  HENT:     Head: Normocephalic and atraumatic.     Right Ear: Tympanic membrane normal.     Left Ear: Tympanic membrane normal.  Neck:     Musculoskeletal: Normal range of motion. No muscular tenderness.  Cardiovascular:     Rate and Rhythm: Normal rate and regular rhythm.     Pulses: Normal  pulses.     Heart sounds: No murmur.  Pulmonary:     Effort: Pulmonary effort is normal. No respiratory distress.     Breath sounds: Normal breath sounds. No wheezing.  Chest:     Breasts:        Right: Normal. No swelling, bleeding, inverted nipple, mass, nipple discharge, skin change or tenderness.        Left: Normal. No swelling, bleeding, inverted nipple, mass, nipple discharge, skin change or tenderness.  Abdominal:     General: Abdomen is flat. Bowel sounds are normal. There is no distension.     Palpations: Abdomen is soft.     Tenderness: There is no abdominal tenderness. There is no guarding.  Musculoskeletal: Normal range of motion.        General: No tenderness.  Lymphadenopathy:     Upper Body:     Right upper body: No axillary adenopathy.     Left upper body: No axillary adenopathy.  Skin:    General: Skin is warm.     Comments: erythma and papules in the webs of fingers on both hands   Neurological:     General: No focal deficit present.     Mental Status: She is alert and oriented to person, place, and time.  Psychiatric:        Mood and Affect: Mood normal.        Behavior: Behavior normal.        Thought Content: Thought content normal.        Judgment: Judgment normal.         Assessment/Plan:   Patient was seen for a health maintenance exam.  Counseled the patient on health maintenance issues. Reviewed her health mainteance schedule and ordered appropriate tests (see orders.) Counseled on regular exercise and weight management. Recommend regular eye exams and dental cleaning.   The following issues were addressed today for health maintenance:   Laurie Gardner was seen today for annual exam.  Diagnoses and all orders for this visit:  Encounter for health maintenance examination in adult- Women's Health Maintenance Plan Advised monthly breast exam and annual mammogram Advised dental exam every six months Discussed stress management Discussed pap smear screening guidelines  Menorrhagia with regular cycle- discussed taking iron during her heavy menses, to take ibuprofen or alleve mid cycle to help with cramps -     TSH -     CBC  Encounter for lipid screening for cardiovascular disease -     Lipid panel  Screening for hematuria or proteinuria -     POCT urinalysis dipstick  Hand eczema- advised to use luke warm water -     hydrocortisone 2.5 % cream; Apply topically 2 (two) times daily.  Screening for diabetes mellitus -     Basic metabolic panel    No follow-ups on file.    Body mass index is 27.47 kg/m.:  Discussed the patient's BMI with patient. The BMI body mass index is 27.47 kg/m.     No future appointments.  Patient Instructions       If you have lab work done today you will be contacted with your lab results within the next 2 weeks.  If you have not heard from [place] then please contact us. The fastest way to get your results is to register for My Chart.   IF you received an x-ray today, you will receive an invoice from [facility]. Please contact [facility] at [phone] with questions or concerns regarding your  invoice.   IF you received labwork today, you will receive an invoice from [facility]. Please contact [facility]  at [phone] with questions or concerns regarding your invoice.   Our billing staff will not be able to assist you with questions regarding bills from these companies.  You will be contacted with the lab results as soon as they are available. The fastest way to get your results is to activate your My Chart account. Instructions are located on the last page of this paperwork. If you have not heard from [place] regarding the results in 2 weeks, please contact this office.

## 2019-01-02 NOTE — Patient Instructions (Addendum)
If you have lab work done today you will be contacted with your lab results within the next 2 weeks.  If you have not heard from [place] then please contact us. The fastest way to get your results is to register for My Chart.   IF you received an x-ray today, you will receive an invoice from [facility]. Please contact [facility] at [phone] with questions or concerns regarding your invoice.   IF you received labwork today, you will receive an invoice from [facility]. Please contact [facility] at [phone] with questions or concerns regarding your invoice.   Our billing staff will not be able to assist you with questions regarding bills from these companies.  You will be contacted with the lab results as soon as they are available. The fastest way to get your results is to activate your My Chart account. Instructions are located on the last page of this paperwork. If you have not heard from [place] regarding the results in 2 weeks, please contact this office.      Dyshidrotic Eczema Dyshidrotic eczema (pompholyx) is a type of eczema that causes very itchy (pruritic), fluid-filled blisters (vesicles) to form on the hands and feet. It can affect people of any age, but is more common before the age of 17. There is no cure, but treatment and certain lifestyle changes can help relieve symptoms. What are the causes? The cause of this condition is not known. What increases the risk? You are more likely to develop this condition if:  You wash your hands frequently.  You have a personal history or family history of eczema, allergies, asthma, or hay fever.  You are allergic to metals such as nickel or cobalt.  You work with cement.  You smoke. What are the signs or symptoms? Symptoms of this condition may affect the hands, feet, or both. Symptoms may come and go (recur), and may include:  Severe itching, which may happen before blisters appear.  Blisters. These may form  suddenly. ? In the early stages, blisters may form near the fingertips. ? In severe cases, blisters may grow to large blister masses (bullae). ? Blisters resolve in 2-3 weeks without bursting. This is followed by a dry phase in which itching eases.  Pain and swelling.  Cracks or long, narrow openings (fissures) in the skin.  Severe dryness.  Ridges on the nails. How is this diagnosed? This condition may be diagnosed based on:  A physical exam.  Your symptoms.  Your medical history.  Skin scrapings to rule out a fungal infection.  Testing a swab of fluid for bacteria (culture).  Removing and checking a small piece of skin (biopsy) in order to test for infection or to rule out other conditions.  Skin patch tests. These tests involve taking patches that contain possible allergens and placing them on your back. Your health care provider will wait a few days and then check to see if an allergic reaction occurred. These tests may be done if your health care provider suspects allergic reactions, or to rule out other types of eczema. You may be referred to a health care provider who specializes in the skin (dermatologist) to help diagnose and treat this condition. How is this treated? There is no cure for this condition, but treatment can help relieve symptoms. Depending on how many blisters you have and how severe they are, your health care provider may suggest:  Avoiding allergens, irritants, or triggers that worsen symptoms. This may involve  lifestyle changes such as: ? Using different lotions or soaps. ? Avoiding hot weather or places that will cause you to sweat a lot. ? Managing stress with coping techniques such as relaxation and exercise, and asking for help when you need it. ? Diet changes as recommended by your health care provider.  Using a clean, damp towel (cool compress) to relieve symptoms.  Soaking in a bath that contains a type of salt that relieves irritation  (aluminum acetate soaks).  Medicine taken by mouth to reduce itching (oral antihistamines).  Medicine applied to the skin to reduce swelling and irritation (topical corticosteroids).  Medicine that reduces the activity of the body's disease-fighting system (immunosuppressants) to treat inflammation. This may be given in severe cases.  Antibiotic medicines to treat bacterial infection.  Light therapy (phototherapy). This involves shining ultraviolet (UV) light on affected skin in order to reduce itchiness and inflammation. Follow these instructions at home: Bathing and skin care   Wash skin gently. After bathing or washing your hands, pat your skin dry. Avoid rubbing your skin.  Remove all jewelry before bathing. If the skin under the jewelry stays wet, blisters may form or get worse.  Apply cool compresses as told by your health care provider: ? Soak a clean towel in cool water. ? Wring out excess water until towel is damp. ? Place the towel over affected skin. Leave the towel on for 20 minutes at a time, 2-3 times a day.  Use mild soaps, cleansers, and lotions that do not contain dyes, perfumes, or other irritants.  Keep your skin hydrated. To do this: ? Avoid very hot water. Take lukewarm baths or showers. ? Apply moisturizer within three minutes of bathing. This locks in moisture. Medicines  Take and apply over-the-counter and prescription medicines only as told by your health care provider.  If you were prescribed antibiotic medicine, take or apply it as told by your health care provider. Do not stop using the antibiotic even if you start to feel better. General instructions  Identify and avoid triggers and allergens.  Keep fingernails short to avoid breaking open the skin while scratching.  Use waterproof gloves to protect your hands when doing work that keeps your hands wet for a long time.  Wear socks to keep your feet dry.  Do not use any products that contain  nicotine or tobacco, such as cigarettes and e-cigarettes. If you need help quitting, ask your health care provider.  Keep all follow-up visits as told by your health care provider. This is important. Contact a health care provider if:  You have symptoms that do not go away.  You have signs of infection, such as: ? Crusting, pus, or a bad smell. ? More redness, swelling, or pain. ? Increased warmth in the affected area. Summary  Dyshidrotic eczema (pompholyx) is a type of eczema that causes very itchy (pruritic), fluid-filled blisters (vesicles) to form on the hands and feet.  The cause of this condition is not known.  There is no cure for this condition, but treatment can help relieve symptoms. Treatment depends on how many blisters you have and how severe they are.  Use mild soaps, cleansers, and lotions that do not contain dyes, perfumes, or other irritants. Keep your skin hydrated. This information is not intended to replace advice given to you by your health care provider. Make sure you discuss any questions you have with your health care provider. Document Released: 11/24/2016 Document Revised: 11/24/2016 Document Reviewed: 11/24/2016 Elsevier Interactive  Patient Education  2019 [employer].

## 2019-01-03 LAB — LIPID PANEL
Chol/HDL Ratio: 4.2 ratio (ref 0.0–4.4)
Cholesterol, Total: 160 mg/dL (ref 100–199)
HDL: 38 mg/dL — ABNORMAL LOW (ref >39)
LDL Calculated: 86 mg/dL (ref 0–99)
Triglycerides: 180 mg/dL — ABNORMAL HIGH (ref 0–149)
VLDL Cholesterol Cal: 36 mg/dL (ref 5–40)

## 2019-01-03 LAB — BASIC METABOLIC PANEL
BUN/Creatinine Ratio: 16 (ref 9–23)
BUN: 10 mg/dL (ref 6–20)
CO2: 21 mmol/L (ref 20–29)
Calcium: 9.6 mg/dL (ref 8.7–10.2)
Chloride: 100 mmol/L (ref 96–106)
Creatinine, Ser: 0.61 mg/dL (ref 0.57–1.00)
GFR calc Af Amer: 141 mL/min/{1.73_m2} (ref >59)
GFR calc non Af Amer: 122 mL/min/{1.73_m2} (ref >59)
Glucose: 82 mg/dL (ref 65–99)
Potassium: 4.4 mmol/L (ref 3.5–5.2)
Sodium: 138 mmol/L (ref 134–144)

## 2019-01-03 LAB — CBC
Hematocrit: 41.1 % (ref 34.0–46.6)
Hemoglobin: 14.3 g/dL (ref 11.1–15.9)
MCH: 31.8 pg (ref 26.6–33.0)
MCHC: 34.8 g/dL (ref 31.5–35.7)
MCV: 91 fL (ref 79–97)
Platelets: 233 10*3/uL (ref 150–450)
RBC: 4.5 x10E6/uL (ref 3.77–5.28)
RDW: 13.1 % (ref 11.7–15.4)
WBC: 8.4 10*3/uL (ref 3.4–10.8)

## 2019-01-03 LAB — TSH: TSH: 1.7 u[IU]/mL (ref 0.450–4.500)

## 2019-01-07 ENCOUNTER — Encounter: Payer: BLUE CROSS/BLUE SHIELD | Admitting: Family Medicine

## 2019-04-17 DIAGNOSIS — Z23 Encounter for immunization: Secondary | ICD-10-CM | POA: Diagnosis not present

## 2019-05-10 ENCOUNTER — Other Ambulatory Visit: Payer: Self-pay

## 2019-05-10 DIAGNOSIS — Z20822 Contact with and (suspected) exposure to covid-19: Secondary | ICD-10-CM

## 2019-05-12 LAB — NOVEL CORONAVIRUS, NAA: SARS-CoV-2, NAA: NOT DETECTED

## 2019-05-17 ENCOUNTER — Other Ambulatory Visit: Payer: Self-pay

## 2019-05-17 DIAGNOSIS — Z20822 Contact with and (suspected) exposure to covid-19: Secondary | ICD-10-CM

## 2019-05-18 LAB — NOVEL CORONAVIRUS, NAA: SARS-CoV-2, NAA: NOT DETECTED

## 2019-05-24 ENCOUNTER — Other Ambulatory Visit: Payer: Self-pay

## 2019-05-24 DIAGNOSIS — Z20822 Contact with and (suspected) exposure to covid-19: Secondary | ICD-10-CM

## 2019-05-25 LAB — NOVEL CORONAVIRUS, NAA: SARS-CoV-2, NAA: NOT DETECTED

## 2019-05-29 ENCOUNTER — Other Ambulatory Visit: Payer: Self-pay

## 2019-05-29 DIAGNOSIS — Z20822 Contact with and (suspected) exposure to covid-19: Secondary | ICD-10-CM

## 2019-05-30 LAB — NOVEL CORONAVIRUS, NAA: SARS-CoV-2, NAA: NOT DETECTED

## 2019-06-05 DIAGNOSIS — N946 Dysmenorrhea, unspecified: Secondary | ICD-10-CM | POA: Diagnosis not present

## 2019-06-05 DIAGNOSIS — Z6827 Body mass index (BMI) 27.0-27.9, adult: Secondary | ICD-10-CM | POA: Diagnosis not present

## 2019-06-05 DIAGNOSIS — Z01419 Encounter for gynecological examination (general) (routine) without abnormal findings: Secondary | ICD-10-CM | POA: Diagnosis not present

## 2019-07-24 ENCOUNTER — Encounter: Payer: Self-pay | Admitting: Family Medicine

## 2019-07-25 ENCOUNTER — Other Ambulatory Visit: Payer: Self-pay | Admitting: *Deleted

## 2019-07-25 DIAGNOSIS — L309 Dermatitis, unspecified: Secondary | ICD-10-CM

## 2019-07-25 MED ORDER — HYDROCORTISONE 2.5 % EX CREA: TOPICAL_CREAM | Freq: Two times a day (BID) | CUTANEOUS | 0 refills | Status: DC

## 2019-08-05 ENCOUNTER — Other Ambulatory Visit: Payer: BLUE CROSS/BLUE SHIELD

## 2019-11-25 ENCOUNTER — Other Ambulatory Visit: Payer: Self-pay

## 2019-11-25 ENCOUNTER — Ambulatory Visit (INDEPENDENT_AMBULATORY_CARE_PROVIDER_SITE_OTHER): Payer: 59 | Admitting: Family Medicine

## 2019-11-25 ENCOUNTER — Ambulatory Visit (INDEPENDENT_AMBULATORY_CARE_PROVIDER_SITE_OTHER): Payer: 59

## 2019-11-25 ENCOUNTER — Encounter: Payer: Self-pay | Admitting: Family Medicine

## 2019-11-25 VITALS — BP 118/80 | HR 83 | Temp 98.2°F | Resp 17 | Ht 69.0 in | Wt 194.0 lb

## 2019-11-25 DIAGNOSIS — Z0000 Encounter for general adult medical examination without abnormal findings: Secondary | ICD-10-CM

## 2019-11-25 DIAGNOSIS — N926 Irregular menstruation, unspecified: Secondary | ICD-10-CM

## 2019-11-25 DIAGNOSIS — G8929 Other chronic pain: Secondary | ICD-10-CM

## 2019-11-25 DIAGNOSIS — M21611 Bunion of right foot: Secondary | ICD-10-CM | POA: Diagnosis not present

## 2019-11-25 DIAGNOSIS — M25562 Pain in left knee: Secondary | ICD-10-CM

## 2019-11-25 DIAGNOSIS — E781 Pure hyperglyceridemia: Secondary | ICD-10-CM

## 2019-11-25 DIAGNOSIS — M25561 Pain in right knee: Secondary | ICD-10-CM | POA: Diagnosis not present

## 2019-11-25 DIAGNOSIS — Z0001 Encounter for general adult medical examination with abnormal findings: Secondary | ICD-10-CM

## 2019-11-25 DIAGNOSIS — Z13228 Encounter for screening for other metabolic disorders: Secondary | ICD-10-CM

## 2019-11-25 LAB — COMPREHENSIVE METABOLIC PANEL
ALT: 8 IU/L (ref 0–32)
AST: 15 IU/L (ref 0–40)
Albumin/Globulin Ratio: 2 (ref 1.2–2.2)
Albumin: 4.6 g/dL (ref 3.8–4.8)
Alkaline Phosphatase: 66 IU/L (ref 39–117)
BUN/Creatinine Ratio: 12 (ref 9–23)
BUN: 9 mg/dL (ref 6–20)
Bilirubin Total: 0.3 mg/dL (ref 0.0–1.2)
CO2: 19 mmol/L — ABNORMAL LOW (ref 20–29)
Calcium: 9 mg/dL (ref 8.7–10.2)
Chloride: 105 mmol/L (ref 96–106)
Creatinine, Ser: 0.73 mg/dL (ref 0.57–1.00)
GFR calc Af Amer: 127 mL/min/{1.73_m2} (ref >59)
GFR calc non Af Amer: 110 mL/min/{1.73_m2} (ref >59)
Globulin, Total: 2.3 g/dL (ref 1.5–4.5)
Glucose: 120 mg/dL — ABNORMAL HIGH (ref 65–99)
Potassium: 4.5 mmol/L (ref 3.5–5.2)
Sodium: 136 mmol/L (ref 134–144)
Total Protein: 6.9 g/dL (ref 6.0–8.5)

## 2019-11-25 LAB — LIPID PANEL
Chol/HDL Ratio: 3.5 ratio (ref 0.0–4.4)
Cholesterol, Total: 130 mg/dL (ref 100–199)
HDL: 37 mg/dL — ABNORMAL LOW (ref >39)
LDL Chol Calc (NIH): 76 mg/dL (ref 0–99)
Triglycerides: 91 mg/dL (ref 0–149)
VLDL Cholesterol Cal: 17 mg/dL (ref 5–40)

## 2019-11-25 LAB — POCT URINE PREGNANCY: Preg Test, Ur: NEGATIVE

## 2019-11-25 MED ORDER — MELOXICAM 7.5 MG PO TABS: 7.5000 mg | ORAL_TABLET | Freq: Every day | ORAL | 6 refills | Status: DC

## 2019-11-25 NOTE — Progress Notes (Signed)
Chief Complaint  Patient presents with  . Annual Exam    cpe w/o pap. Pt has gyn.   Pt has concerns about left knee pain- pt has hx of 2 left knee surgeries and has concern about ? bunion on right great toe    Subjective:  Laurie Gardner is a 34 y.o. female here for a health maintenance visit.  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    Past Medical History:  Diagnosis Date  . Medical history non-contributory     Past Surgical History:  Procedure Laterality Date  . arthroscopic knee surgery     left knee  . PATELLAR TENDON REPAIR Left   . WISDOM TOOTH EXTRACTION       Outpatient Medications Prior to Visit  Medication Sig Dispense Refill  . hydrocortisone 2.5 % cream Apply topically 2 (two) times daily. 30 g 0  . polyethylene glycol powder (GLYCOLAX/MIRALAX) powder Take 17 g by mouth daily. 500 g 1  . Prenatal Vit-Fe Fumarate-FA (PRENATAL MULTIVITAMIN) TABS tablet Take 1 tablet by mouth at bedtime.     . ranitidine (ZANTAC) 150 MG tablet Take 1 tablet (150 mg total) by mouth 2 (two) times daily. (Patient not taking: Reported on 01/02/2019) 60 tablet 0   No facility-administered medications prior to visit.    Allergies  Allergen Reactions  . Penicillins Rash    Has patient had a PCN reaction causing immediate rash, facial/tongue/throat  swelling, SOB or lightheadedness with hypotension: No Has patient had a PCN reaction causing severe rash involving mucus membranes or skin necrosis: No Has patient had a PCN reaction that required hospitalization: No Has patient had a PCN reaction occurring within the last 10 years: No If all of the above answers are "NO", then may proceed with Cephalosporin use.      Family History  Problem Relation Age of Onset  . Melanoma Mother   . Migraines Mother   . Hypertension Mother   . Melanoma Maternal Grandmother   . Cancer Paternal Grandmother   . Other Paternal Grandfather      Health Habits: Dental Exam: up to date Eye Exam: up to date Exercise: 5 times/week on average Current exercise activities: walking/running Diet: balanced   Social History   Socioeconomic History  . Marital status: Married    Spouse name: Not on file  . Number of children: Not on file  . Years of education: Not on file  . Highest education level: Not on file  Occupational History  . Occupation: Nurse, adult: [employer]  Tobacco Use  . Smoking status: Never Smoker  . Smokeless tobacco: Never Used  Substance and Sexual Activity  . Alcohol use: Yes    Comment: not often; not while preg  . Drug  use: No  . Sexual activity: Yes    Partners: Male  Other Topics Concern  . Not on file  Social History Narrative   Single, engaged. Education: [employer].    Exercise: Yes   Works at [employer] 100%   Guns in home - no   Social Determinants of Radio broadcast assistant Strain:   . Difficulty of Paying Living Expenses:   Food Insecurity:   . Worried About Charity fundraiser in the Last Year:   . Arboriculturist in the Last Year:   Transportation Needs:   . Film/video editor (Medical):   Marland Kitchen Lack of Transportation (Non-Medical):   Physical Activity:   . Days of Exercise per Week:   . Minutes of Exercise per Session:    Stress:   . Feeling of Stress :   Social Connections:   . Frequency of Communication with Friends and Family:   . Frequency of Social Gatherings with Friends and Family:   . Attends Religious Services:   . Active Member of Clubs or Organizations:   . Attends Archivist Meetings:   Marland Kitchen Marital Status:   Intimate Partner Violence:   . Fear of Current or Ex-Partner:   . Emotionally Abused:   Marland Kitchen Physically Abused:   . Sexually Abused:    Social History   Substance and Sexual Activity  Alcohol Use Yes   Comment: not often; not while preg   Social History   Tobacco Use  Smoking Status Never Smoker  Smokeless Tobacco Never Used   Social History   Substance and Sexual Activity  Drug Use No    GYN: Sexual Health Menstrual status: regular menses LMP: Patient's last menstrual period was 10/28/2019. Last pap smear: see HM section History of abnormal pap smears:  Sexually active:  with female partner Current contraception:   Health Maintenance: See under health Maintenance activity for review of completion dates as well. Immunization History  Administered Date(s) Administered  . DTaP 09/16/1988, 11/18/1988, 01/27/1989, 09/09/1992, 02/01/1993  . HPV Quadrivalent 04/26/2006, 06/27/2006, 10/30/2006  . Hepatitis A 12/05/2003, 01/04/2007  . Hepatitis B 04/27/2000, 06/02/2000, 11/02/2000  . HiB (PRP-OMP) 01/05/1990  . IPV 09/16/1988, 11/18/1988, 01/05/1990, 09/09/1992  . Influenza Whole 03/25/2013  . Influenza-Unspecified 05/12/2014, 07/05/2017  . MMR 01/05/1990, 09/09/1992  . Meningococcal Conjugate 01/11/2007  . Td 12/05/2003, 01/04/2007  . Tdap 01/04/2007, 01/02/2018      Depression Screen-PHQ2/9 Depression screen [facility] 2/9 11/25/2019 01/02/2019 01/02/2018 12/06/2016 12/25/2015  Decreased Interest 0 0 0 0 0  Down, Depressed, Hopeless 0 0 0 0 0  PHQ - 2 Score 0 0 0 0 0       Depression Severity and Treatment Recommendations:  0-4= None  5-9= Mild / Treatment:  Support, educate to call if worse; return in one month  10-14= Moderate / Treatment: Support, watchful waiting; Antidepressant or Psycotherapy  15-19= Moderately severe / Treatment: Antidepressant OR Psychotherapy  >= 20 = Major depression, severe / Antidepressant AND Psychotherapy    Review of Systems   ROS  See HPI for ROS as well.   Review of Systems  Constitutional: Negative for activity change, appetite change, chills and fever.  HENT: Negative for congestion, nosebleeds, trouble swallowing and voice change.   Respiratory: Negative for cough, shortness of breath and wheezing.   Gastrointestinal: Negative for diarrhea, nausea and vomiting.  Genitourinary: Negative for difficulty urinating, dysuria, flank pain and hematuria.  Musculoskeletal:  Negative for back pain, joint swelling and neck pain. See hpi Neurological: Negative for dizziness, speech difficulty, light-headedness and numbness.  See HPI. All other review of systems negative.    Objective:   Vitals:   11/25/19 0817  BP: 118/80  Pulse: 83  Resp: 17  Temp: 98.2 F (36.8 C)  TempSrc: Temporal  SpO2: 98%  Weight: 194 lb (88 kg)  Height: '5\' 9"'  (1.753 m)    Body mass index is 28.65 kg/m.  Physical Exam  BP 118/80 (BP Location: Right Arm, Patient Position: Sitting, Cuff Size: Normal)   Pulse 83   Temp 98.2 F (36.8 C) (Temporal)   Resp 17   Ht '5\' 9"'  (1.753 m)   Wt 194 lb (88 kg)   LMP 10/28/2019   SpO2 98%   BMI 28.65 kg/m   General Appearance:    Alert, cooperative, no distress, appears stated age  Head:    Normocephalic, without obvious abnormality, atraumatic  Eyes:    conjunctiva/corneas clear, EOM's intact  Ears:    Normal TM's and external ear canals, both ears  Neck:   Supple, symmetrical, trachea midline, no adenopathy;    thyroid:  no enlargement/tenderness/nodules  Back:     Symmetric, no curvature, ROM normal, no CVA tenderness  Lungs:     Clear to auscultation bilaterally, respirations  unlabored  Chest Wall:    No tenderness or deformity   Heart:    Regular rate and rhythm, S1 and S2 normal, no murmur, rub   or gallop  Abdomen:     Soft, non-tender, bowel sounds active all four quadrants,    no masses, no organomegaly  Extremities:   Extremities normal, atraumatic, no cyanosis or edema  Pulses:   2+ and symmetric all extremities  Skin:   Skin color, texture, turgor normal, no rashes or lesions  Lymph nodes:   Cervical, supraclavicular, and axillary nodes normal  Neurologic:   CNII-XII intact, normal strength, sensation and reflexes    throughout   Left knee with crepitus and stiffness, without effusion, no erythema Right knee without erythema, no crepitus  Right great toe with enlarged bump at the MTP   CLINICAL DATA:  Crepitus.  Pain.  Prior surgery.  EXAM: LEFT KNEE - COMPLETE 4+ VIEW  COMPARISON:  None  FINDINGS: Enthesophyte at the patellar tendon origin. No joint effusion. Mild but significantly age advanced 3 compartment osteoarthritis, as evidenced by joint space narrowing and osteophyte formation.  IMPRESSION: Mild but significantly age advanced osteoarthritis. No acute superimposed process.   Electronically Signed   By: Abigail Miyamoto M.D.   On: 11/25/2019 08:59  CLINICAL DATA:  34 year old female with chronic knee pain  EXAM: RIGHT KNEE - COMPLETE 4+ VIEW  COMPARISON:  None.  FINDINGS: No acute displaced fracture. Early marginal osteophyte formation at the medial joint space. No joint effusion. Unremarkable patellofemoral joint. No radiopaque foreign body.  IMPRESSION: Negative for acute bony abnormality.  Early medial knee joint osteoarthritis.   Electronically Signed   By: Corrie Mckusick D.O.   On: 11/25/2019 08:58   Assessment/Plan:   Patient was seen for a health maintenance exam.  Counseled the patient on health maintenance issues. Reviewed her health mainteance schedule and ordered appropriate tests (see  orders.) Counseled on regular exercise and weight management. Recommend regular eye exams and dental cleaning.   The following issues were addressed today for health maintenance:   Laurie Gardner was seen today for annual exam.  Diagnoses and all orders for this visit:  Encounter for health maintenance examination in adult  - Women's Health Maintenance Plan Advised monthly breast exam and annual mammogram Advised dental exam every six months Discussed stress management Discussed pap smear screening guidelines  Urine beta hcg negative   Screening for metabolic disorder -     Comprehensive metabolic panel  Hypertriglyceridemia- will assess  -     Lipid panel  Chronic pain of both knees-  Referral to Orthopedics, pt could benefit from PT -     DG Knee Complete 4 Views Left -     DG Knee Complete 4 Views Right -     meloxicam (MOBIC) 7.5 MG tablet; Take 1 tablet (7.5 mg total) by mouth daily. -     Ambulatory referral to Orthopedic Surgery  Missed period -  Negative, checked before xray  -     POCT urine pregnancy  Bunion of great toe of right foot - referral placed for Podiatry -     Ambulatory referral to Podiatry    No follow-ups on file.    Body mass index is 28.65 kg/m.:  Discussed the patient's BMI with patient. The BMI body mass index is 28.65 kg/m.     No future appointments.  Patient Instructions       If you have lab work done today you will be contacted with your lab results within the next 2 weeks.  If you have not heard from [place] then please contact us. The fastest way to get your results is to register for My Chart.   IF you received an x-ray today, you will receive an invoice from [facility]. Please contact [facility] at [phone] with questions or concerns regarding your invoice.   IF you received labwork today, you will receive an invoice from [facility]. Please contact [facility] at [phone] with questions or concerns regarding your  invoice.   Our billing staff will not be able to assist you with questions regarding bills from these companies.  You will be contacted with the lab results as soon as they are available. The fastest way to get your results is to activate your My Chart account. Instructions are located on the last page of this paperwork. If you have not heard from [place] regarding the results in 2 weeks, please contact this office.     Bunion  A bunion is a bump on the base of the big toe that forms when the bones of the big toe joint move out of position. Bunions may be small at first, but they often get larger over time. They can make walking painful. What are the causes? A bunion may be caused by:  Wearing narrow or pointed shoes that force the big toe to press against the other toes.  Abnormal foot development that causes the foot to roll inward (pronate).  Changes in the foot that are caused by certain diseases, such as rheumatoid arthritis or polio.  A foot injury. What increases the risk? The following factors may make you more likely to develop this condition:  Wearing shoes that squeeze the toes together.  Having certain diseases, such as: ? Rheumatoid arthritis. ? Polio. ? Cerebral palsy.  Having family members who have bunions.  Being born with a foot deformity, such as flat feet or low arches.  Doing activities that put a lot of pressure on the feet, such as ballet dancing. What are the signs or symptoms? The main symptom of a bunion is a noticeable bump on the big toe. Other  symptoms may include:  Pain.  Swelling around the big toe.  Redness and inflammation.  Thick or hardened skin on the big toe or between the toes.  Stiffness or loss of motion in the big toe.  Trouble with walking. How is this diagnosed? A bunion may be diagnosed based on your symptoms, medical history, and activities. You may have tests, such as:  X-rays. These allow your health care provider to check  the position of the bones in your foot and look for damage to your joint. They also help your health care provider determine the severity of your bunion and the best way to treat it.  Joint aspiration. In this test, a sample of fluid is removed from the toe joint. This test may be done if you are in a lot of pain. It helps rule out diseases that cause painful swelling of the joints, such as arthritis. How is this treated? Treatment depends on the severity of your symptoms. The goal of treatment is to relieve symptoms and prevent the bunion from getting worse. Your health care provider may recommend:  Wearing shoes that have a wide toe box.  Using bunion pads to cushion the affected area.  Taping your toes together to keep them in a normal position.  Placing a device inside your shoe (orthotics) to help reduce pressure on your toe joint.  Taking medicine to ease pain, inflammation, and swelling.  Applying heat or ice to the affected area.  Doing stretching exercises.  Surgery to remove scar tissue and move the toes back into their normal position. This treatment is rare. Follow these instructions at home: Managing pain, stiffness, and swelling   If directed, put ice on the painful area: ? Put ice in a plastic bag. ? Place a towel between your skin and the bag. ? Leave the ice on for 20 minutes, 2-3 times a day. Activity   If directed, apply heat to the affected area before you exercise. Use the heat source that your health care provider recommends, such as a moist heat pack or a heating pad. ? Place a towel between your skin and the heat source. ? Leave the heat on for 20-30 minutes. ? Remove the heat if your skin turns bright red. This is especially important if you are unable to feel pain, heat, or cold. You may have a greater risk of getting burned.  Do exercises as told by your health care provider. General instructions  Support your toe joint with proper footwear, shoe  padding, or taping as told by your health care provider.  Take over-the-counter and prescription medicines only as told by your health care provider.  Keep all follow-up visits as told by your health care provider. This is important. Contact a health care provider if your symptoms:  Get worse.  Do not improve in 2 weeks. Get help right away if you have:  Severe pain and trouble with walking. Summary  A bunion is a bump on the base of the big toe that forms when the bones of the big toe joint move out of position.  Bunions can make walking painful.  Treatment depends on the severity of your symptoms.  Support your toe joint with proper footwear, shoe padding, or taping as told by your health care provider. This information is not intended to replace advice given to you by your health care provider. Make sure you discuss any questions you have with your health care provider. Document Revised: 01/15/2018 Document Reviewed: 11/21/2017  Elsevier Patient Education  [employer].

## 2019-11-25 NOTE — Patient Outreach (Signed)
Received referral from Dr. Delia Chimes at [place] through [facility].  Care management team will not be able to assist with this patient. The patient is not attributed to [facility]

## 2019-11-25 NOTE — Patient Instructions (Addendum)
If you have lab work done today you will be contacted with your lab results within the next 2 weeks.  If you have not heard from [place] then please contact us. The fastest way to get your results is to register for My Chart.   IF you received an x-ray today, you will receive an invoice from [facility] Radiology. Please contact [facility] Radiology at [phone] with questions or concerns regarding your invoice.   IF you received labwork today, you will receive an invoice from [facility]. Please contact [facility] at [phone] with questions or concerns regarding your invoice.   Our billing staff will not be able to assist you with questions regarding bills from these companies.  You will be contacted with the lab results as soon as they are available. The fastest way to get your results is to activate your My Chart account. Instructions are located on the last page of this paperwork. If you have not heard from [place] regarding the results in 2 weeks, please contact this office.     Bunion  A bunion is a bump on the base of the big toe that forms when the bones of the big toe joint move out of position. Bunions may be small at first, but they often get larger over time. They can make walking painful. What are the causes? A bunion may be caused by:  Wearing narrow or pointed shoes that force the big toe to press against the other toes.  Abnormal foot development that causes the foot to roll inward (pronate).  Changes in the foot that are caused by certain diseases, such as rheumatoid arthritis or polio.  A foot injury. What increases the risk? The following factors may make you more likely to develop this condition:  Wearing shoes that squeeze the toes together.  Having certain diseases, such as: ? Rheumatoid arthritis. ? Polio. ? Cerebral palsy.  Having family members who have bunions.  Being born with a foot deformity, such as flat feet or low arches.  Doing activities that  put a lot of pressure on the feet, such as ballet dancing. What are the signs or symptoms? The main symptom of a bunion is a noticeable bump on the big toe. Other symptoms may include:  Pain.  Swelling around the big toe.  Redness and inflammation.  Thick or hardened skin on the big toe or between the toes.  Stiffness or loss of motion in the big toe.  Trouble with walking. How is this diagnosed? A bunion may be diagnosed based on your symptoms, medical history, and activities. You may have tests, such as:  X-rays. These allow your health care provider to check the position of the bones in your foot and look for damage to your joint. They also help your health care provider determine the severity of your bunion and the best way to treat it.  Joint aspiration. In this test, a sample of fluid is removed from the toe joint. This test may be done if you are in a lot of pain. It helps rule out diseases that cause painful swelling of the joints, such as arthritis. How is this treated? Treatment depends on the severity of your symptoms. The goal of treatment is to relieve symptoms and prevent the bunion from getting worse. Your health care provider may recommend:  Wearing shoes that have a wide toe box.  Using bunion pads to cushion the affected area.  Taping your toes together to keep them in a  normal position.  Placing a device inside your shoe (orthotics) to help reduce pressure on your toe joint.  Taking medicine to ease pain, inflammation, and swelling.  Applying heat or ice to the affected area.  Doing stretching exercises.  Surgery to remove scar tissue and move the toes back into their normal position. This treatment is rare. Follow these instructions at home: Managing pain, stiffness, and swelling   If directed, put ice on the painful area: ? Put ice in a plastic bag. ? Place a towel between your skin and the bag. ? Leave the ice on for 20 minutes, 2-3 times a  day. Activity   If directed, apply heat to the affected area before you exercise. Use the heat source that your health care provider recommends, such as a moist heat pack or a heating pad. ? Place a towel between your skin and the heat source. ? Leave the heat on for 20-30 minutes. ? Remove the heat if your skin turns bright red. This is especially important if you are unable to feel pain, heat, or cold. You may have a greater risk of getting burned.  Do exercises as told by your health care provider. General instructions  Support your toe joint with proper footwear, shoe padding, or taping as told by your health care provider.  Take over-the-counter and prescription medicines only as told by your health care provider.  Keep all follow-up visits as told by your health care provider. This is important. Contact a health care provider if your symptoms:  Get worse.  Do not improve in 2 weeks. Get help right away if you have:  Severe pain and trouble with walking. Summary  A bunion is a bump on the base of the big toe that forms when the bones of the big toe joint move out of position.  Bunions can make walking painful.  Treatment depends on the severity of your symptoms.  Support your toe joint with proper footwear, shoe padding, or taping as told by your health care provider. This information is not intended to replace advice given to you by your health care provider. Make sure you discuss any questions you have with your health care provider. Document Revised: 01/15/2018 Document Reviewed: 11/21/2017 Elsevier Patient Education  [employer].

## 2019-12-19 ENCOUNTER — Encounter: Payer: Self-pay | Admitting: Podiatry

## 2019-12-19 ENCOUNTER — Ambulatory Visit (INDEPENDENT_AMBULATORY_CARE_PROVIDER_SITE_OTHER): Payer: 59

## 2019-12-19 ENCOUNTER — Ambulatory Visit (INDEPENDENT_AMBULATORY_CARE_PROVIDER_SITE_OTHER): Payer: 59 | Admitting: Podiatry

## 2019-12-19 ENCOUNTER — Other Ambulatory Visit: Payer: Self-pay

## 2019-12-19 VITALS — BP 117/69 | HR 80 | Temp 97.6°F | Resp 16

## 2019-12-19 DIAGNOSIS — M2011 Hallux valgus (acquired), right foot: Secondary | ICD-10-CM

## 2019-12-19 DIAGNOSIS — M2012 Hallux valgus (acquired), left foot: Secondary | ICD-10-CM

## 2019-12-19 DIAGNOSIS — M2010 Hallux valgus (acquired), unspecified foot: Secondary | ICD-10-CM

## 2019-12-19 NOTE — Progress Notes (Signed)
Subjective:   Patient ID: Laurie Gardner, female   DOB: 34 y.o.   MRN: [id number]   HPI Patient presents with painful bunion deformity right that is making shoe gear difficult and has gotten worse over the last 2 years.  Patient states she can only wear certain shoes and has been getting gradually more aggravating for her.  Patient does not smoke likes to be active   Review of Systems  All other systems reviewed and are negative.       Objective:  Physical Exam Vitals and nursing note reviewed.  Constitutional:      Appearance: She is well-developed.  Pulmonary:     Effort: Pulmonary effort is normal.  Musculoskeletal:        General: Normal range of motion.  Skin:    General: Skin is warm.  Neurological:     Mental Status: She is alert.     Neurovascular status intact muscle strength found to be adequate range of motion within normal limits.  Patient is noted to have hyperostosis medial aspect first metatarsal head right over left with redness around the joint surface and pain with palpation.  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  Due to longstanding nature and chronic pain with numerous conservative treatments not successful she is opted for surgical intervention.  She wants to review consent form today she needs to do this soon and I allowed her to go over consent form reviewing alternative treatments complications associated with surgery.  She understands there is no guarantees of success and after extensive review she signed consent form understanding total recovery can take 6 months to 1 year.  Patient also is dispensed air fracture walker with all  instructions on usage and is encouraged to call with questions concerns prior to procedure  X-rays indicate significant elevation of the intermetatarsal angle of approximate 16 degrees with hyperostosis and quite a bit of clinical inflammation around the first metatarsal head right over left foot

## 2019-12-19 NOTE — Progress Notes (Signed)
   Subjective:    Patient ID: Laurie Gardner, female    DOB: [birth year], 34 y.o.   MRN: [id number]  HPI    Review of Systems  All other systems reviewed and are negative.      Objective:   Physical Exam        Assessment & Plan:

## 2019-12-19 NOTE — Patient Instructions (Addendum)
Bunion  A bunion is a bump on the base of the big toe that forms when the bones of the big toe joint move out of position. Bunions may be small at first, but they often get larger over time. They can make walking painful. What are the causes? A bunion may be caused by:  Wearing narrow or pointed shoes that force the big toe to press against the other toes.  Abnormal foot development that causes the foot to roll inward (pronate).  Changes in the foot that are caused by certain diseases, such as rheumatoid arthritis or polio.  A foot injury. What increases the risk? The following factors may make you more likely to develop this condition:  Wearing shoes that squeeze the toes together.  Having certain diseases, such as: ? Rheumatoid arthritis. ? Polio. ? Cerebral palsy.  Having family members who have bunions.  Being born with a foot deformity, such as flat feet or low arches.  Doing activities that put a lot of pressure on the feet, such as ballet dancing. What are the signs or symptoms? The main symptom of a bunion is a noticeable bump on the big toe. Other symptoms may include:  Pain.  Swelling around the big toe.  Redness and inflammation.  Thick or hardened skin on the big toe or between the toes.  Stiffness or loss of motion in the big toe.  Trouble with walking. How is this diagnosed? A bunion may be diagnosed based on your symptoms, medical history, and activities. You may have tests, such as:  X-rays. These allow your health care provider to check the position of the bones in your foot and look for damage to your joint. They also help your health care provider determine the severity of your bunion and the best way to treat it.  Joint aspiration. In this test, a sample of fluid is removed from the toe joint. This test may be done if you are in a lot of pain. It helps rule out diseases that cause painful swelling of the joints, such as arthritis. How is this  treated? Treatment depends on the severity of your symptoms. The goal of treatment is to relieve symptoms and prevent the bunion from getting worse. Your health care provider may recommend:  Wearing shoes that have a wide toe box.  Using bunion pads to cushion the affected area.  Taping your toes together to keep them in a normal position.  Placing a device inside your shoe (orthotics) to help reduce pressure on your toe joint.  Taking medicine to ease pain, inflammation, and swelling.  Applying heat or ice to the affected area.  Doing stretching exercises.  Surgery to remove scar tissue and move the toes back into their normal position. This treatment is rare. Follow these instructions at home: Managing pain, stiffness, and swelling   If directed, put ice on the painful area: ? Put ice in a plastic bag. ? Place a towel between your skin and the bag. ? Leave the ice on for 20 minutes, 2-3 times a day. Activity   If directed, apply heat to the affected area before you exercise. Use the heat source that your health care provider recommends, such as a moist heat pack or a heating pad. ? Place a towel between your skin and the heat source. ? Leave the heat on for 20-30 minutes. ? Remove the heat if your skin turns bright red. This is especially important if you are unable to feel pain,   heat, or cold. You may have a greater risk of getting burned.  Do exercises as told by your health care provider. General instructions  Support your toe joint with proper footwear, shoe padding, or taping as told by your health care provider.  Take over-the-counter and prescription medicines only as told by your health care provider.  Keep all follow-up visits as told by your health care provider. This is important. Contact a health care provider if your symptoms:  Get worse.  Do not improve in 2 weeks. Get help right away if you have:  Severe pain and trouble with walking. Summary  A  bunion is a bump on the base of the big toe that forms when the bones of the big toe joint move out of position.  Bunions can make walking painful.  Treatment depends on the severity of your symptoms.  Support your toe joint with proper footwear, shoe padding, or taping as told by your health care provider. This information is not intended to replace advice given to you by your health care provider. Make sure you discuss any questions you have with your health care provider. Document Revised: 01/15/2018 Document Reviewed: 11/21/2017 Elsevier Patient Education  2020 [place], you have decided to take an important step towards improving your quality of life.  You can be assured that the doctors and staff at [facility] will be with you every step of the way.  Here are some important things you should know:  5. Plan to be at the surgery center/hospital at least 1 (one) hour prior to your scheduled time, unless otherwise directed by the surgical center/hospital staff.  You must have a responsible adult accompany you, remain during the surgery and drive you home.  Make sure you have directions to the surgical center/hospital to ensure you arrive on time. 6. If you are having surgery at [facility] or [facility], you will need a copy of your medical history and physical form from your family physician within one month prior to the date of surgery. We will give you a form for your primary physician to complete.  7. We make every effort to accommodate the date you request for surgery.  However, there are times where surgery dates or times have to be moved.  We will contact you as soon as possible if a change in schedule is required.   8. No aspirin/ibuprofen for one week before surgery.  If you are on aspirin, any non-steroidal anti-inflammatory medications (Mobic, Aleve, Ibuprofen) should not be taken seven (7) days prior to your  surgery.  You make take Tylenol for pain prior to surgery.  9. Medications - If you are taking daily heart and blood pressure medications, seizure, reflux, allergy, asthma, anxiety, pain or diabetes medications, make sure you notify the surgery center/hospital before the day of surgery so they can tell you which medications you should take or avoid the day of surgery. 10. No food or drink after midnight the night before surgery unless directed otherwise by surgical center/hospital staff. 11. No alcoholic beverages [id number] prior to surgery.  No smoking 24-hours prior or 24-hours after surgery. 12. Wear loose pants or shorts. They should be loose enough to fit over bandages, boots, and casts. 13. Don't wear slip-on shoes. Sneakers are preferred. 34. Bring your boot with you to the surgery center/hospital.  Also bring crutches or a walker if your physician has prescribed it for you.  If you  do not have this equipment, it will be provided for you after surgery. 15. If you have not been contacted by the surgery center/hospital by the day before your surgery, call to confirm the date and time of your surgery. 35. Leave-time from work may vary depending on the type of surgery you have.  Appropriate arrangements should be made prior to surgery with your employer. 17. Prescriptions will be provided immediately following surgery by your doctor.  Fill these as soon as possible after surgery and take the medication as directed. Pain medications will not be refilled on weekends and must be approved by the doctor. 18. Remove nail polish on the operative foot and avoid getting pedicures prior to surgery. 45. Wash the night before surgery.  The night before surgery wash the foot and leg well with water and the antibacterial soap provided. Be sure to pay special attention to beneath the toenails and in between the toes.  Wash for at least three (3) minutes. Rinse thoroughly with water and dry well with a towel.  Perform  this wash unless told not to do so by your physician.  Enclosed: 1 Ice pack (please put in freezer the night before surgery)   1 Hibiclens skin cleaner   Pre-op instructions  If you have any questions regarding the instructions, please do not hesitate to call our office.  [place]: [address], [place], [place] [address] -- [place]: [address] [place], [place] [address] -- [phone]  [place]: [place] [address], [place], [place] [address] -- [phone]   Website: [email or web address]

## 2020-02-12 ENCOUNTER — Telehealth: Payer: Self-pay

## 2020-02-12 NOTE — Telephone Encounter (Signed)
DOS 02/18/20  [name] BUNIONECTOMY [facility] - [phone]  [employer] EFFECTIVE DATE - 07/26/19  PLAN DEDUCTIBLE - $2000.00 W/ $900.93 REMAINING OUT OF POCKET - $4000.00 W/ $2900.93 REMAINGIN COPAY $0.00 COINSURANCE - 10%   NOTIFICATION/PRIOR AUTHORIZATION NUMBER CASE STATUS CASE STATUS REASON PRIMARY CARE PHYSICIAN [id number] Closed Case Was Managed And Is Now Complete - ADVANCE NOTIFY DATE/TIME ADMISSION NOTIFY DATE/TIME 02/11/2020 09:20 AM CDT - COVERAGE STATUS OVERALL COVERAGE STATUS Covered/Approved 1-1 CODE DESCRIPTION COVERAGE STATUS DECISION DATE [facility] Coverage determination is reflected for the facility admission and is not a guarantee of payment for ongoing services. Covered/Approved 02/12/2020 1 [id number] Correction, hallux valgus (bunionectomy) more Covered/Approved 02/12/2020

## 2020-02-17 MED ORDER — OXYCODONE-ACETAMINOPHEN 10-325 MG PO TABS: 1.0000 | ORAL_TABLET | ORAL | 0 refills | Status: DC | PRN

## 2020-02-17 MED ORDER — ONDANSETRON HCL 4 MG PO TABS: 4.0000 mg | ORAL_TABLET | Freq: Three times a day (TID) | ORAL | 0 refills | Status: DC | PRN

## 2020-02-17 NOTE — Addendum Note (Signed)
Addended by: Wallene Huh on: 02/17/2020 03:31 PM   Modules accepted: Orders

## 2020-02-18 ENCOUNTER — Encounter: Payer: Self-pay | Admitting: Podiatry

## 2020-02-18 ENCOUNTER — Telehealth: Payer: Self-pay | Admitting: Podiatry

## 2020-02-18 DIAGNOSIS — M2011 Hallux valgus (acquired), right foot: Secondary | ICD-10-CM

## 2020-02-18 MED ORDER — OXYCODONE-ACETAMINOPHEN 10-325 MG PO TABS: 1.0000 | ORAL_TABLET | Freq: Three times a day (TID) | ORAL | 0 refills | Status: DC | PRN

## 2020-02-18 MED ORDER — OXYCODONE-ACETAMINOPHEN 10-325 MG PO TABS: 1.0000 | ORAL_TABLET | Freq: Three times a day (TID) | ORAL | 0 refills | Status: AC | PRN

## 2020-02-18 NOTE — Telephone Encounter (Signed)
Changed post op pain medication Rx to q8h PRN, #25 on behalf of Dr Cheryll Dessert, [facility] 02/18/2020

## 2020-02-18 NOTE — Telephone Encounter (Signed)
Pt called and she states the insurance will only fill partial pain medication prescription.

## 2020-02-18 NOTE — Telephone Encounter (Signed)
Left message for pt to call concerning her medication.

## 2020-02-18 NOTE — Addendum Note (Signed)
Addended bySherryle Lis, Jacy Brocker R on: 02/18/2020 06:05 PM   Modules accepted: Orders

## 2020-02-18 NOTE — Telephone Encounter (Signed)
Left message informing pt the pain medication had been updated for pick up and to read the post op instructions she could also take 800mg  of ibprofen 3 times in between the doses of the percocet for more even pain control and to take the percocet about 30 minutes after the nausea medication and take percocet and ibuprofen with a light snack or milk.

## 2020-02-18 NOTE — Telephone Encounter (Signed)
Pt had surgery today and is having some difficulty getting her medications because insurance is not covering the whole prescription. Pharmacy told the patient they were sending our office some forms to fill out and sign in order for insurance to cover. Patient calling to give [place] a heads up that the forms are coming.

## 2020-02-18 NOTE — Telephone Encounter (Signed)
Insurance would not cover #25 tablets, q8h x 7 days, but will cover #21 tablets x 7 days. Limited to 7 day Rx per [place] STOP act. New Rx for percocet 10mg -325mg  #10 tablets, q8h x 7 days sent. I spoke with the pharmacist and they confirmed this will be covered.  Lanae Crumbly, DPM 02/18/2020

## 2020-02-18 NOTE — Telephone Encounter (Signed)
I spoke with [facility] states the insurance will only fill for 3x a day. Orders sent to doctor on call to change to 3x day.

## 2020-02-18 NOTE — Addendum Note (Signed)
Addended bySherryle Lis, Yalonda Sample R on: 02/18/2020 11:22 AM   Modules accepted: Orders

## 2020-02-19 ENCOUNTER — Telehealth: Payer: Self-pay | Admitting: Family Medicine

## 2020-02-19 NOTE — Telephone Encounter (Signed)
Referral Followup °

## 2020-02-26 ENCOUNTER — Encounter: Payer: Self-pay | Admitting: Podiatry

## 2020-02-26 ENCOUNTER — Ambulatory Visit (INDEPENDENT_AMBULATORY_CARE_PROVIDER_SITE_OTHER): Payer: 59

## 2020-02-26 ENCOUNTER — Other Ambulatory Visit: Payer: Self-pay

## 2020-02-26 ENCOUNTER — Ambulatory Visit (INDEPENDENT_AMBULATORY_CARE_PROVIDER_SITE_OTHER): Payer: 59 | Admitting: Podiatry

## 2020-02-26 VITALS — Temp 97.6°F

## 2020-02-26 DIAGNOSIS — M2011 Hallux valgus (acquired), right foot: Secondary | ICD-10-CM | POA: Diagnosis not present

## 2020-02-26 NOTE — Progress Notes (Signed)
Subjective:   Patient ID: Laurie Gardner, female   DOB: 34 y.o.   MRN: [id number]   HPI Patient is doing very well with surgery and very happy with the results of bunion correction   ROS      Objective:  Physical Exam  Neurovascular status intact negative Bevelyn Buckles' sign noted wound edges well coapted hallux in rectus position good range of motion first MPJ     Assessment:  Doing well post distal osteotomy right foot     Plan:  H&P reviewed condition and at this point I went ahead took x-rays and then reapplied sterile dressing and instructed on continued elevation.  Reappoint for [place] to recheck again in 3 weeks or earlier if needed and instructed on range of motion exercises elevation and immobilization  X-rays indicate osteotomies healing well good alignment noted fixation in place

## 2020-03-18 ENCOUNTER — Other Ambulatory Visit: Payer: Self-pay

## 2020-03-18 ENCOUNTER — Encounter: Payer: Self-pay | Admitting: Podiatry

## 2020-03-18 ENCOUNTER — Ambulatory Visit (INDEPENDENT_AMBULATORY_CARE_PROVIDER_SITE_OTHER): Payer: 59 | Admitting: Podiatry

## 2020-03-18 ENCOUNTER — Ambulatory Visit (INDEPENDENT_AMBULATORY_CARE_PROVIDER_SITE_OTHER): Payer: 59

## 2020-03-18 VITALS — Temp 97.3°F

## 2020-03-18 DIAGNOSIS — M2011 Hallux valgus (acquired), right foot: Secondary | ICD-10-CM

## 2020-03-18 NOTE — Progress Notes (Signed)
Subjective:   Patient ID: Laurie Gardner, female   DOB: 34 y.o.   MRN: [id number]   HPI Patient states overall I am doing very well but I do get some swelling around the big toe joint   ROS      Objective:  Physical Exam  Neurovascular status intact negative Bevelyn Buckles' sign noted first MPJ right healing well wound edges well coapted good alignment no signs of crepitus of the joint surface     Assessment:  Doing well post osteotomy right first metatarsal     Plan:  X-ray reviewed and patient may gradually return to soft shoe gear and instructed on the importance of range of motion exercises and walking on her right foot.  Reappoint 4 weeks or earlier if needed  X-rays indicate osteotomies healing well joint congruence fixation in place

## 2020-04-27 ENCOUNTER — Encounter: Payer: 59 | Admitting: Podiatry

## 2020-05-01 ENCOUNTER — Ambulatory Visit (INDEPENDENT_AMBULATORY_CARE_PROVIDER_SITE_OTHER): Payer: 59

## 2020-05-01 ENCOUNTER — Encounter: Payer: Self-pay | Admitting: Podiatry

## 2020-05-01 ENCOUNTER — Ambulatory Visit (INDEPENDENT_AMBULATORY_CARE_PROVIDER_SITE_OTHER): Payer: 59 | Admitting: Podiatry

## 2020-05-01 ENCOUNTER — Other Ambulatory Visit: Payer: Self-pay

## 2020-05-01 DIAGNOSIS — M2011 Hallux valgus (acquired), right foot: Secondary | ICD-10-CM

## 2020-05-02 NOTE — Progress Notes (Signed)
Subjective:   Patient ID: Laurie Gardner, female   DOB: 34 y.o.   MRN: [id number]   HPI Patient presents stating doing well with occasional swelling but overall feeling well concerned my big toe is not perfectly straight   ROS      Objective:  Physical Exam  Neurovascular status intact negative Bevelyn Buckles' sign noted wound edges well coapted hallux is slightly bent but for the most part rectus at the MPJ with good range of motion no crepitus      Assessment:  Doing well post osteotomy first metatarsal right     Plan:  Reviewed condition and recommended continued walking on this anti-inflammatories and relative rigid bottom shoes.  Excellent healing on x-ray  X-ray indicates osteotomy is healing well fixation in place good alignment noted

## 2020-11-27 ENCOUNTER — Ambulatory Visit: Payer: 59 | Admitting: Physician Assistant

## 2020-12-16 ENCOUNTER — Ambulatory Visit: Payer: 59 | Admitting: Physician Assistant

## 2021-01-05 ENCOUNTER — Ambulatory Visit: Payer: 59 | Admitting: Physician Assistant

## 2021-01-14 ENCOUNTER — Ambulatory Visit: Payer: 59 | Admitting: Nurse Practitioner

## 2021-10-05 ENCOUNTER — Other Ambulatory Visit: Payer: Self-pay

## 2021-10-05 ENCOUNTER — Encounter: Payer: Self-pay | Admitting: Nurse Practitioner

## 2021-10-05 ENCOUNTER — Ambulatory Visit (INDEPENDENT_AMBULATORY_CARE_PROVIDER_SITE_OTHER): Payer: 59 | Admitting: Nurse Practitioner

## 2021-10-05 VITALS — BP 122/78 | HR 81 | Temp 98.4°F | Ht 69.0 in | Wt 191.2 lb

## 2021-10-05 DIAGNOSIS — Z7689 Persons encountering health services in other specified circumstances: Secondary | ICD-10-CM

## 2021-10-05 DIAGNOSIS — Z2821 Immunization not carried out because of patient refusal: Secondary | ICD-10-CM

## 2021-10-05 DIAGNOSIS — E7800 Pure hypercholesterolemia, unspecified: Secondary | ICD-10-CM

## 2021-10-05 DIAGNOSIS — L309 Dermatitis, unspecified: Secondary | ICD-10-CM

## 2021-10-05 DIAGNOSIS — Z0000 Encounter for general adult medical examination without abnormal findings: Secondary | ICD-10-CM | POA: Diagnosis not present

## 2021-10-05 DIAGNOSIS — Z872 Personal history of diseases of the skin and subcutaneous tissue: Secondary | ICD-10-CM

## 2021-10-05 DIAGNOSIS — Z9889 Other specified postprocedural states: Secondary | ICD-10-CM

## 2021-10-05 NOTE — Progress Notes (Signed)
?I,Victoria T Hamilton,acting as a scribe for Minette Brine, FNP.,have documented all relevant documentation on the behalf of Minette Brine, FNP,as directed by  Minette Brine, FNP while in the presence of Minette Brine, Day.  ? ?This visit occurred during the SARS-CoV-2 public health emergency.  Safety protocols were in place, including screening questions prior to the visit, additional usage of staff PPE, and extensive cleaning of exam room while observing appropriate contact time as indicated for disinfecting solutions. ? ?Subjective:  ?  ? Patient ID: Laurie Gardner , female    DOB: March 31, 1986 , 36 y.o.   MRN: [id number] ? ? ?Chief Complaint  ?Patient presents with  ? Establish Care  ?   ?  ? ? ?HPI ? ?Pt presents today to establish care. Previous [place], [place]. She does want to primarily est care.  ?She has no specific questions or concerns. She would like to have a HM done, she works at [employer].  ?She currently does have a GYN, pt will sch pap soon. [facility] [employer] ? ?Married with one child.   ? ?Exercises 2 times a week. Diet - eats increased breads and pastas. She does eat ground [place].   ?  ? ?Past Medical History:  ?Diagnosis Date  ? Medical history non-contributory   ?  ? ?Family History  ?Problem Relation Age of Onset  ? Melanoma Mother   ? Migraines Mother   ? Hypertension Mother   ? Melanoma Maternal Grandmother   ? Brain cancer Maternal Grandfather   ? Cancer Paternal Grandmother   ? Other Paternal Grandfather   ? ? ? ?Current Outpatient Medications:  ?  Prenatal Vit-Fe Fumarate-FA (PRENATAL MULTIVITAMIN) TABS tablet, Take 1 tablet by mouth at bedtime. , Disp: , Rfl:  ?  hydrocortisone 2.5 % cream, Apply topically 2 (two) times daily., Disp: 30 g, Rfl: 0  ? ?Allergies  ?Allergen Reactions  ? Penicillins Rash  ?  Has patient had a PCN reaction causing immediate rash, facial/tongue/throat swelling, SOB or lightheadedness with hypotension: No ?Has patient had a PCN reaction causing severe rash  involving mucus membranes or skin necrosis: No ?Has patient had a PCN reaction that required hospitalization: No ?Has patient had a PCN reaction occurring within the last 10 years: No ?If all of the above answers are "NO", then may proceed with Cephalosporin use. ?  ?  ? ?Review of Systems  ?Constitutional: Negative.   ?HENT: Negative.    ?Eyes: Negative.   ?Respiratory: Negative.    ?Cardiovascular: Negative.   ?Gastrointestinal: Negative.   ?Endocrine: Negative.   ?Genitourinary: Negative.   ?Musculoskeletal: Negative.   ?Skin: Negative.   ?Allergic/Immunologic: Negative.   ?Neurological: Negative.   ?Hematological: Negative.   ?Psychiatric/Behavioral: Negative.     ? ?Today's Vitals  ? 10/05/21 1518  ?BP: 122/78  ?Pulse: 81  ?Temp: 98.4 ?F (36.9 ?C)  ?Weight: 191 lb 3.2 oz (86.7 kg)  ?Height: '5\' 9"'  (1.753 m)  ? ?Body mass index is 28.24 kg/m?.  ?Wt Readings from Last 3 Encounters:  ?10/05/21 191 lb 3.2 oz (86.7 kg)  ?11/25/19 194 lb (88 kg)  ?01/02/19 186 lb (84.4 kg)  ?  ?Objective:  ?Physical Exam ?Vitals reviewed.  ?Constitutional:   ?   General: She is not in acute distress. ?   Appearance: Normal appearance. She is well-developed.  ?HENT:  ?   Head: Normocephalic and atraumatic.  ?   Right Ear: Hearing, tympanic membrane, ear canal and external ear normal. There is no impacted  cerumen.  ?   Left Ear: Hearing, tympanic membrane, ear canal and external ear normal. There is no impacted cerumen.  ?   Nose:  ?   Comments: Deferred - masked ?   Mouth/Throat:  ?   Comments: Deferred - masked ?Eyes:  ?   General: Lids are normal.  ?   Extraocular Movements: Extraocular movements intact.  ?   Conjunctiva/sclera: Conjunctivae normal.  ?   Pupils: Pupils are equal, round, and reactive to light.  ?   Funduscopic exam: ?   Right eye: No papilledema.     ?   Left eye: No papilledema.  ?Neck:  ?   Thyroid: No thyroid mass.  ?   Vascular: No carotid bruit.  ?Cardiovascular:  ?   Rate and Rhythm: Normal rate and regular  rhythm.  ?   Pulses: Normal pulses.  ?   Heart sounds: Normal heart sounds. No murmur heard. ?Pulmonary:  ?   Effort: Pulmonary effort is normal. No respiratory distress.  ?   Breath sounds: Normal breath sounds. No wheezing.  ?Chest:  ?   Chest wall: No mass.  ?Breasts: ?   Tanner Score is 5.  ?   Right: Normal. No mass or tenderness.  ?   Left: Normal. No mass or tenderness.  ?Abdominal:  ?   General: Abdomen is flat. Bowel sounds are normal. There is no distension.  ?   Palpations: Abdomen is soft.  ?   Tenderness: There is no abdominal tenderness.  ?Genitourinary: ?   Rectum: Guaiac result negative.  ?Musculoskeletal:     ?   General: No swelling. Normal range of motion.  ?   Cervical back: Full passive range of motion without pain, normal range of motion and neck supple.  ?   Right lower leg: No edema.  ?   Left lower leg: No edema.  ?Lymphadenopathy:  ?   Upper Body:  ?   Right upper body: No supraclavicular, axillary or pectoral adenopathy.  ?   Left upper body: No supraclavicular, axillary or pectoral adenopathy.  ?Skin: ?   General: Skin is warm and dry.  ?   Capillary Refill: Capillary refill takes less than 2 seconds.  ?Neurological:  ?   General: No focal deficit present.  ?   Mental Status: She is alert and oriented to person, place, and time.  ?   Cranial Nerves: No cranial nerve deficit.  ?   Sensory: No sensory deficit.  ?Psychiatric:     ?   Mood and Affect: Mood normal.     ?   Behavior: Behavior normal.     ?   Thought Content: Thought content normal.     ?   Judgment: Judgment normal.  ?  ? ?   ?Assessment And Plan:  ?   ?1. Encounter for health maintenance examination ?Behavior modifications discussed and diet history reviewed.   ?Pt will continue to exercise regularly and modify diet with low GI, plant based foods and decrease intake of processed foods.  ?Recommend intake of daily multivitamin, Vitamin D, and calcium.  ?Recommend for preventive screenings, as well as recommend immunizations  that include influenza, TDAP ?- CBC ? ?2. Elevated cholesterol ?Comments: Previous history of elevated cholesterol, no current medications. Encouraged to eat a low fat diet. ?- Lipid panel ?- CMP14+EGFR ? ?3. Eczema of both hands ?Comments: She uses hydrocortisone, 6 months or more she had an outbreak.  ? ?4. COVID-19 vaccination declined ?Declines covid 19  vaccine. Discussed risk of covid 24 and if she changes her mind about the vaccine to call the office.  Encouraged to take multivitamin, vitamin d, vitamin c and zinc to increase immune system. Aware can call office if would like to have vaccine here at office.  ? ?5. History of removal of nevus ?Comments: From back in 2 areas, scarring present ? ?6. Encounter to establish care ?  ? ? ?Patient was given opportunity to ask questions. Patient verbalized understanding of the plan and was able to repeat key elements of the plan. All questions were answered to their satisfaction.  ?Minette Brine, FNP  ? ?I, Minette Brine, FNP, have reviewed all documentation for this visit. The documentation on 10/05/21 for the exam, diagnosis, procedures, and orders are all accurate and complete.  ? ?IF YOU HAVE BEEN REFERRED TO A SPECIALIST, IT MAY TAKE 1-2 WEEKS TO SCHEDULE/PROCESS THE REFERRAL. IF YOU HAVE NOT HEARD FROM US/SPECIALIST IN TWO WEEKS, PLEASE GIVE [place] A CALL AT [phone] X [phone].  ? ?THE PATIENT IS ENCOURAGED TO PRACTICE SOCIAL DISTANCING DUE TO THE COVID-19 PANDEMIC.   ?

## 2021-10-05 NOTE — Patient Instructions (Signed)

## 2021-10-06 LAB — CMP14+EGFR
ALT: 12 IU/L (ref 0–32)
AST: 17 IU/L (ref 0–40)
Albumin/Globulin Ratio: 2 (ref 1.2–2.2)
Albumin: 4.7 g/dL (ref 3.8–4.8)
Alkaline Phosphatase: 60 IU/L (ref 44–121)
BUN/Creatinine Ratio: 16 (ref 9–23)
BUN: 12 mg/dL (ref 6–20)
Bilirubin Total: 0.2 mg/dL (ref 0.0–1.2)
CO2: 22 mmol/L (ref 20–29)
Calcium: 9.2 mg/dL (ref 8.7–10.2)
Chloride: 103 mmol/L (ref 96–106)
Creatinine, Ser: 0.77 mg/dL (ref 0.57–1.00)
Globulin, Total: 2.4 g/dL (ref 1.5–4.5)
Glucose: 104 mg/dL — ABNORMAL HIGH (ref 70–99)
Potassium: 4.5 mmol/L (ref 3.5–5.2)
Sodium: 138 mmol/L (ref 134–144)
Total Protein: 7.1 g/dL (ref 6.0–8.5)
eGFR: 104 mL/min/{1.73_m2} (ref >59)

## 2021-10-06 LAB — LIPID PANEL
Chol/HDL Ratio: 3.9 ratio (ref 0.0–4.4)
Cholesterol, Total: 144 mg/dL (ref 100–199)
HDL: 37 mg/dL — ABNORMAL LOW (ref >39)
LDL Chol Calc (NIH): 72 mg/dL (ref 0–99)
Triglycerides: 212 mg/dL — ABNORMAL HIGH (ref 0–149)
VLDL Cholesterol Cal: 35 mg/dL (ref 5–40)

## 2021-10-06 LAB — CBC
Hematocrit: 37.5 % (ref 34.0–46.6)
Hemoglobin: 12.3 g/dL (ref 11.1–15.9)
MCH: 29.3 pg (ref 26.6–33.0)
MCHC: 32.8 g/dL (ref 31.5–35.7)
MCV: 89 fL (ref 79–97)
Platelets: 259 10*3/uL (ref 150–450)
RBC: 4.2 x10E6/uL (ref 3.77–5.28)
RDW: 12.5 % (ref 11.7–15.4)
WBC: 6.2 10*3/uL (ref 3.4–10.8)

## 2021-10-14 DIAGNOSIS — E7800 Pure hypercholesterolemia, unspecified: Secondary | ICD-10-CM | POA: Insufficient documentation

## 2021-10-14 DIAGNOSIS — L309 Dermatitis, unspecified: Secondary | ICD-10-CM | POA: Insufficient documentation

## 2022-03-06 IMAGING — DX DG KNEE COMPLETE 4+V*L*
4 series · 4 of 4 positions shown · non-contrast
Comparison: None

CLINICAL DATA: Crepitus.  Pain.  Prior surgery.

EXAM:
LEFT KNEE - COMPLETE 4+ VIEW

[knee ap]
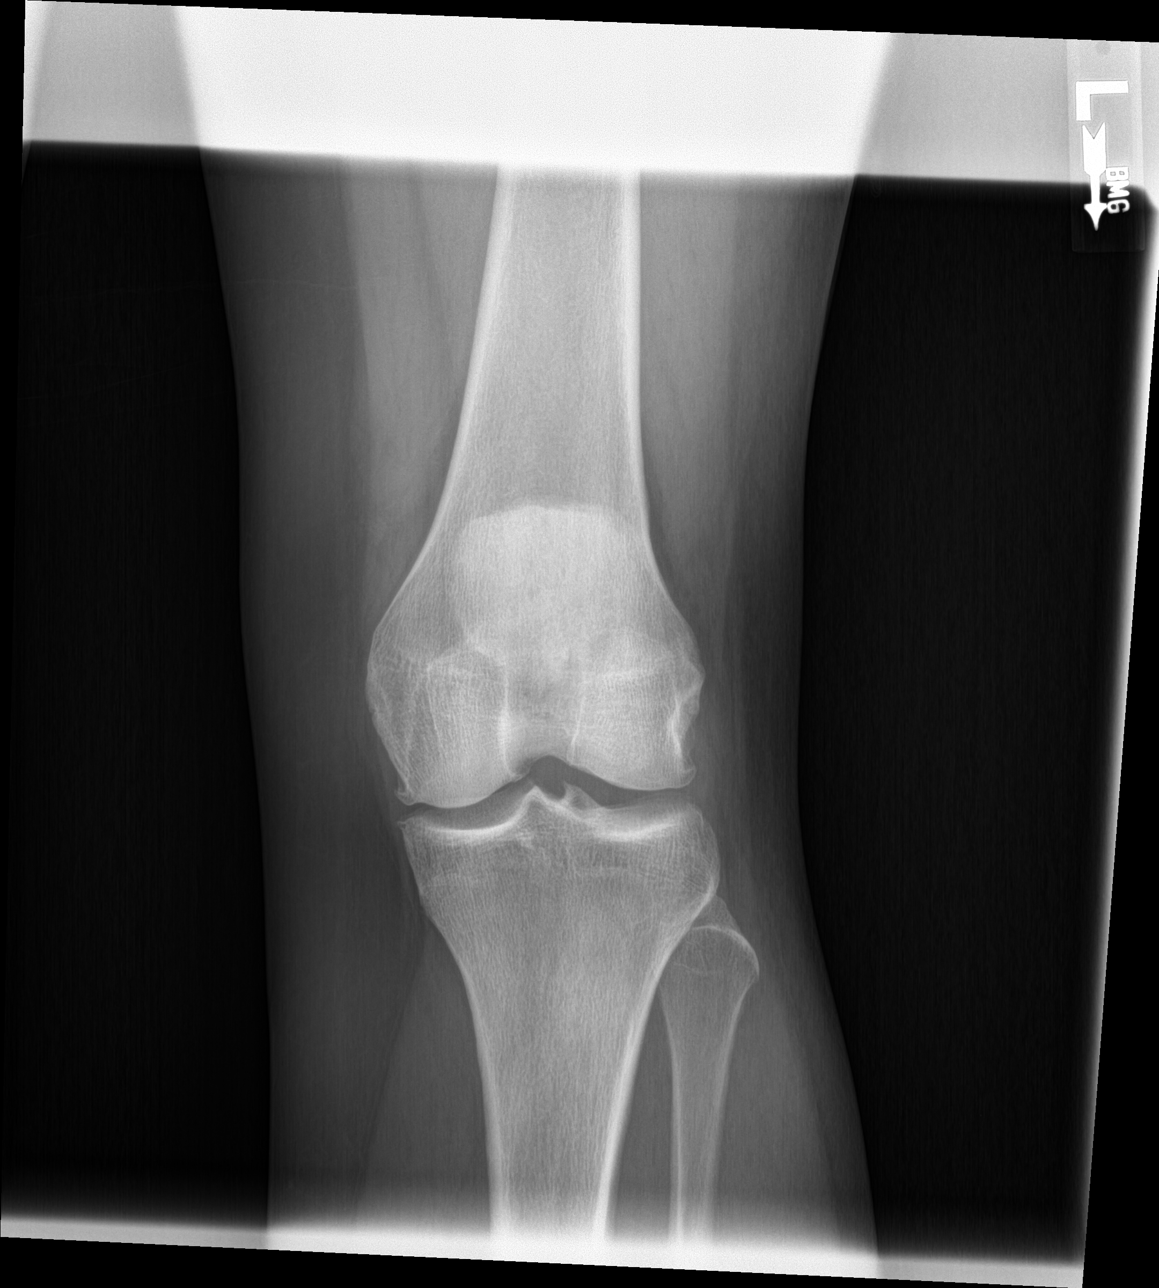

[knee lat]
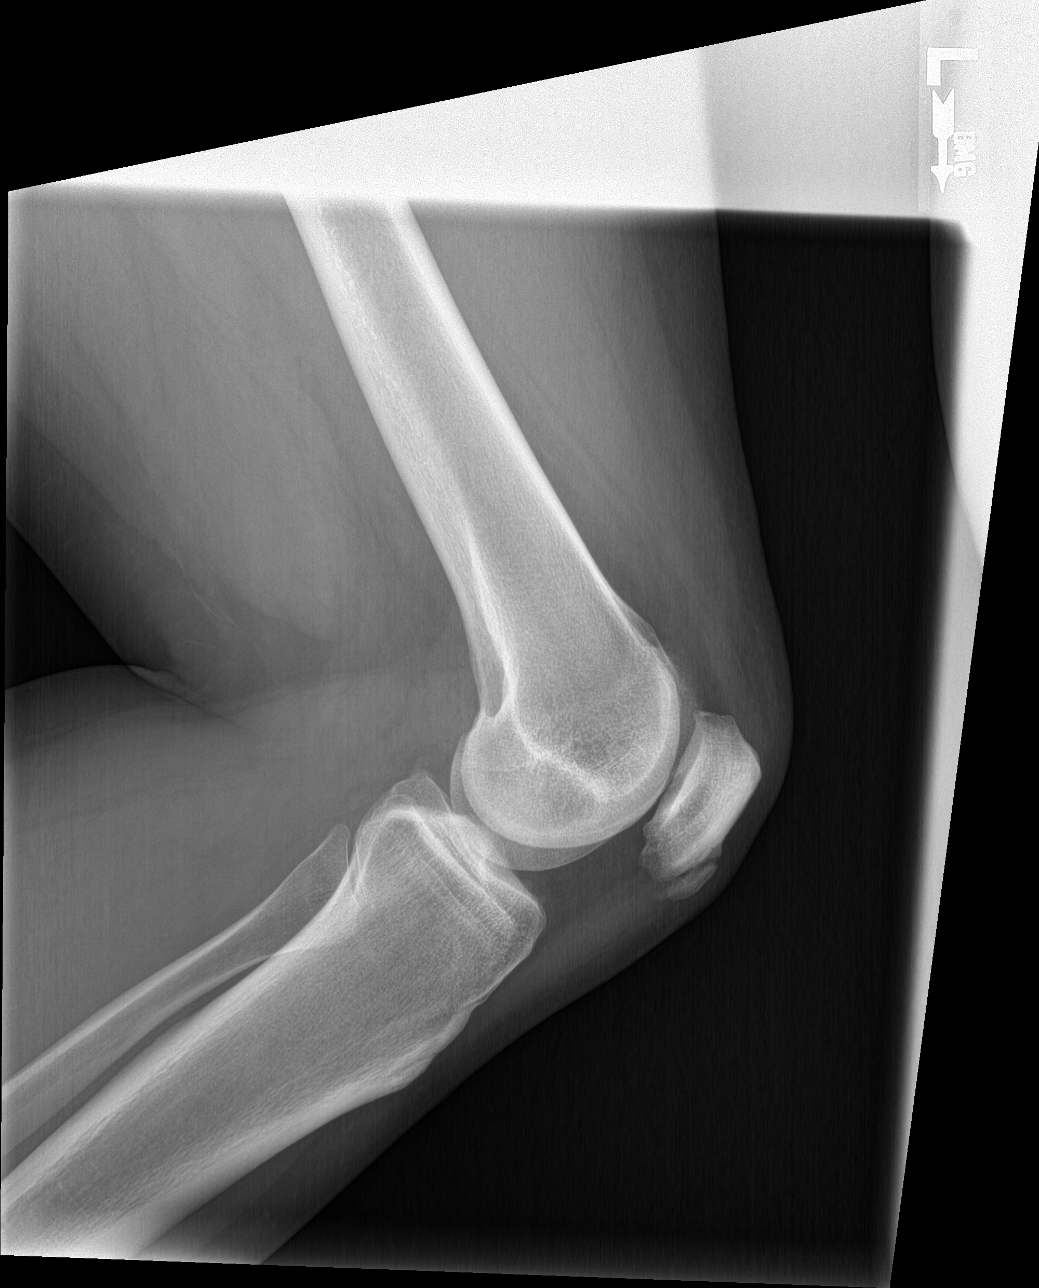

[sunrise]
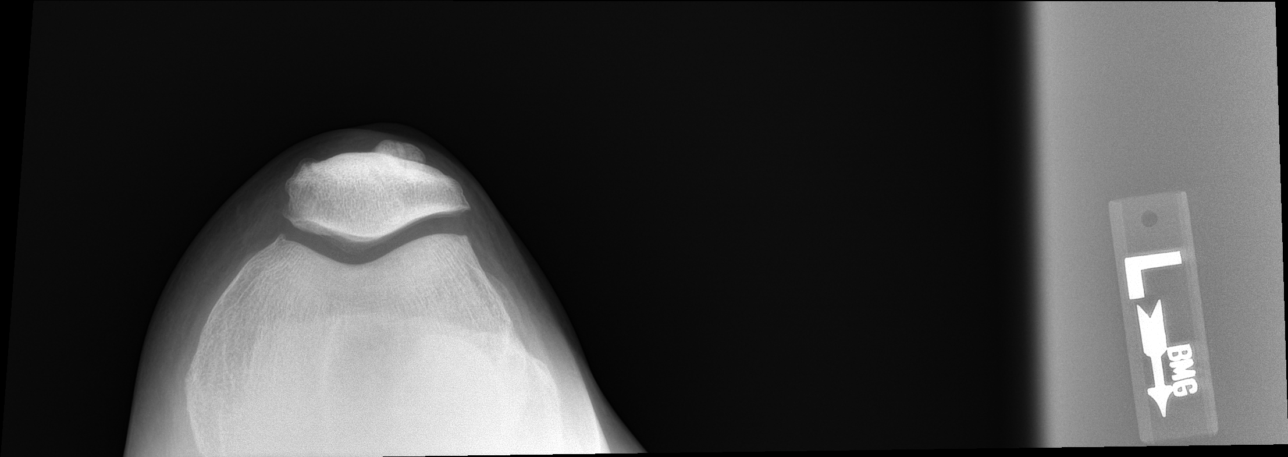

[knee [person_name]]
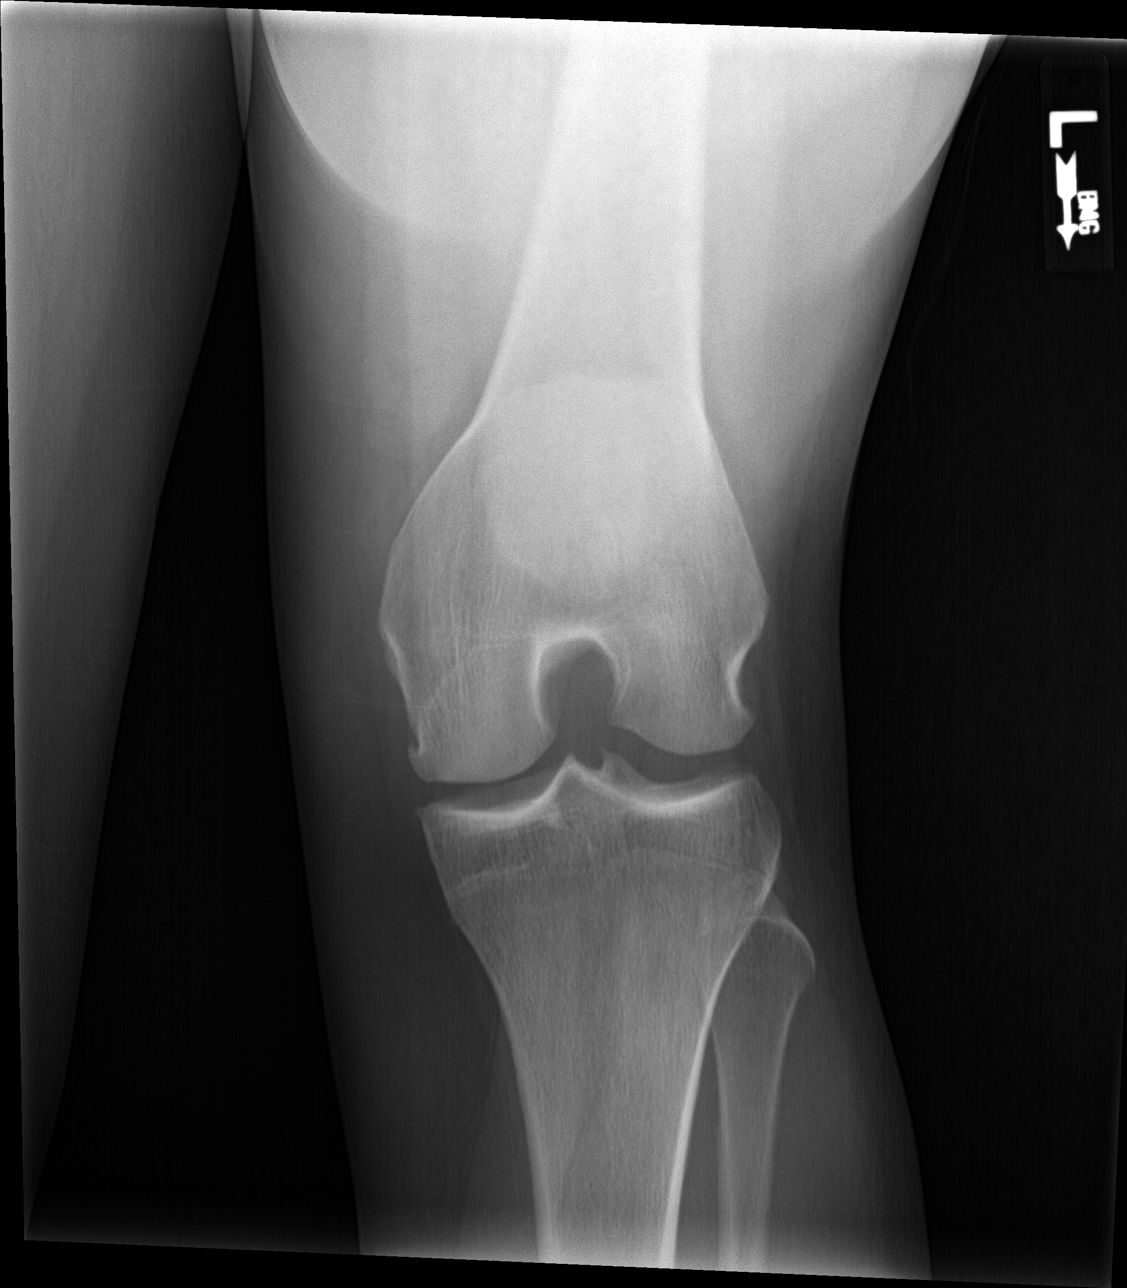

[4 of 4 positions shown; findings below may reference images not displayed]

FINDINGS: Enthesophyte at the patellar tendon origin. No joint effusion. Mild
but significantly age advanced 3 compartment osteoarthritis, as
evidenced by joint space narrowing and osteophyte formation.
IMPRESSION: Mild but significantly age advanced osteoarthritis. No acute
superimposed process.

## 2022-03-07 LAB — HM PAP SMEAR

## 2022-04-16 ENCOUNTER — Encounter: Payer: Self-pay | Admitting: Nurse Practitioner

## 2022-10-10 ENCOUNTER — Encounter: Payer: Self-pay | Admitting: Nurse Practitioner

## 2022-10-10 ENCOUNTER — Ambulatory Visit (INDEPENDENT_AMBULATORY_CARE_PROVIDER_SITE_OTHER): Payer: 59 | Admitting: Nurse Practitioner

## 2022-10-10 VITALS — BP 112/62 | HR 89 | Temp 98.1°F | Ht 70.0 in | Wt 196.0 lb

## 2022-10-10 DIAGNOSIS — Z0000 Encounter for general adult medical examination without abnormal findings: Secondary | ICD-10-CM | POA: Diagnosis not present

## 2022-10-10 DIAGNOSIS — E781 Pure hyperglyceridemia: Secondary | ICD-10-CM

## 2022-10-10 DIAGNOSIS — D229 Melanocytic nevi, unspecified: Secondary | ICD-10-CM | POA: Diagnosis not present

## 2022-10-10 DIAGNOSIS — Z1321 Encounter for screening for nutritional disorder: Secondary | ICD-10-CM

## 2022-10-10 DIAGNOSIS — Z2821 Immunization not carried out because of patient refusal: Secondary | ICD-10-CM

## 2022-10-10 DIAGNOSIS — Z6828 Body mass index (BMI) 28.0-28.9, adult: Secondary | ICD-10-CM | POA: Diagnosis not present

## 2022-10-10 DIAGNOSIS — E786 Lipoprotein deficiency: Secondary | ICD-10-CM | POA: Diagnosis not present

## 2022-10-10 DIAGNOSIS — Z8639 Personal history of other endocrine, nutritional and metabolic disease: Secondary | ICD-10-CM

## 2022-10-10 NOTE — Progress Notes (Signed)
Note above

## 2022-10-10 NOTE — Progress Notes (Signed)
Subjective:     Patient ID: Laurie Gardner , female    DOB: Aug 30, 1985 , 37 y.o.   MRN: [id number]   Chief Complaint  Patient presents with   Annual Exam    HPI  Patient presents today for annual exam. She is followed by Dr.  Julien Girt for gynecology. She exercises two days per week with walking including 1 hike a week for about 2 miles. She has been adjusting her diet but is not consistent. She has reduced carbohydrates by limiting pasta and breads. She has began to eat smaller portions. She is increasing fruits and vegetables per meal. LMP 10/05/2022, regular.   She denies chills, fever, throat soreness. The patient denies chest pain, palpitations, and dyspnea.  Wt Readings from Last 3 Encounters: 10/10/22 : 196 lb (88.9 kg) 10/05/21 : 191 lb 3.2 oz (86.7 kg) 11/25/19 : 194 lb (88 kg)       Past Medical History:  Diagnosis Date   Medical history non-contributory      Family History  Problem Relation Age of Onset   Melanoma Mother    Migraines Mother    Hypertension Mother    Melanoma Maternal Grandmother    Brain cancer Maternal Grandfather    Cancer Paternal Grandmother    Other Paternal Grandfather      Current Outpatient Medications:    Prenatal Vit-Fe Fumarate-FA (PRENATAL MULTIVITAMIN) TABS tablet, Take 1 tablet by mouth at bedtime. , Disp: , Rfl:    Allergies  Allergen Reactions   Penicillins Rash    Has patient had a PCN reaction causing immediate rash, facial/tongue/throat swelling, SOB or lightheadedness with hypotension: No Has patient had a PCN reaction causing severe rash involving mucus membranes or skin necrosis: No Has patient had a PCN reaction that required hospitalization: No Has patient had a PCN reaction occurring within the last 10 years: No If all of the above answers are "NO", then may proceed with Cephalosporin use.      Review of Systems  Constitutional: Negative.   HENT: Negative.    Eyes: Negative.   Respiratory: Negative.     Cardiovascular: Negative.   Gastrointestinal: Negative.   Endocrine: Negative.   Genitourinary: Negative.   Musculoskeletal: Negative.   Skin: Negative.        Right side face mole changing textures over the last couple months and darker in color. She does not intentionally go out to tan. Has had a few moles removed in the past - precancerous. Family history of melanoma.   Allergic/Immunologic: Negative.   Neurological: Negative.   Hematological: Negative.   Psychiatric/Behavioral: Negative.       Today's Vitals   10/10/22 1055  BP: 112/62  Pulse: 89  Temp: 98.1 F (36.7 C)  TempSrc: Oral  SpO2: 98%  Weight: 196 lb (88.9 kg)  Height: 5\' 10"  (1.778 m)   Body mass index is 28.12 kg/m.   Objective:  Physical Exam Vitals reviewed.  Constitutional:      General: She is not in acute distress.    Appearance: Normal appearance. She is well-developed.  HENT:     Head: Normocephalic and atraumatic.     Right Ear: Hearing, tympanic membrane, ear canal and external ear normal. There is no impacted cerumen.     Left Ear: Hearing, tympanic membrane, ear canal and external ear normal. There is no impacted cerumen.     Nose: Nose normal.     Mouth/Throat:     Mouth: Mucous membranes are moist.  Eyes:     General: Lids are normal.     Extraocular Movements: Extraocular movements intact.     Conjunctiva/sclera: Conjunctivae normal.     Pupils: Pupils are equal, round, and reactive to light.     Funduscopic exam:    Right eye: No papilledema.        Left eye: No papilledema.  Neck:     Thyroid: No thyroid mass.     Vascular: No carotid bruit.  Cardiovascular:     Rate and Rhythm: Normal rate and regular rhythm.     Pulses: Normal pulses.     Heart sounds: Normal heart sounds. No murmur heard. Pulmonary:     Effort: Pulmonary effort is normal. No respiratory distress.     Breath sounds: Normal breath sounds. No wheezing.  Chest:     Chest wall: No mass.  Breasts:    Tanner  Score is 5.     Right: Normal. No mass or tenderness.     Left: Normal. No mass or tenderness.  Abdominal:     General: Abdomen is flat. Bowel sounds are normal. There is no distension.     Palpations: Abdomen is soft.     Tenderness: There is no abdominal tenderness.  Genitourinary:    Rectum: Guaiac result negative.  Musculoskeletal:        General: No swelling. Normal range of motion.     Cervical back: Full passive range of motion without pain, normal range of motion and neck supple.     Right lower leg: No edema.     Left lower leg: No edema.  Lymphadenopathy:     Upper Body:     Right upper body: No supraclavicular, axillary or pectoral adenopathy.     Left upper body: No supraclavicular, axillary or pectoral adenopathy.  Skin:    General: Skin is warm and dry.     Capillary Refill: Capillary refill takes less than 2 seconds.     Findings: Lesion (right cheekRight cheek with slightly change in color and irregular borders) present.  Neurological:     General: No focal deficit present.     Mental Status: She is alert and oriented to person, place, and time.     Cranial Nerves: No cranial nerve deficit.     Sensory: No sensory deficit.  Psychiatric:        Mood and Affect: Mood normal.        Behavior: Behavior normal.        Thought Content: Thought content normal.        Judgment: Judgment normal.         Assessment And Plan:     1. Encounter for health maintenance examination Behavior modifications discussed and diet history reviewed.   Pt will continue to exercise regularly and modify diet with low GI, plant based foods and decrease intake of processed foods.  Recommend intake of daily multivitamin, Vitamin D, and calcium.  Recommend for preventive screenings, as well as recommend immunizations that include influenza, TDAP - CBC - CMP14+EGFR  2. Encounter for vitamin deficiency screening - VITAMIN D 25 Hydroxy (Vit-D Deficiency, Fractures)  3. BMI  28.0-28.9,adult Comments: Continue focusing on healthy diet and regular exercise.  4. History of elevated glucose - Hemoglobin A1c  5. High triglycerides Comments: Will recheck lipid panel. Discussed foods to avoid - Lipid panel  6. Low HDL (under 40) Comments: Discussed increasing intake of fish, nuts and avocado.   7. Atypical nevi Comments: Right cheek with  slightly change in color and irregular borders, previous history of nevi removal and being precancerous per patient. - Ambulatory referral to Dermatology  8. COVID-19 vaccination declined Declines covid 19 vaccine. Discussed risk of covid 55 and if she changes her mind about the vaccine to call the office. Education has been provided regarding the importance of this vaccine but patient still declined. Advised may receive this vaccine at local pharmacy or Health Dept.or vaccine clinic. Aware to provide a copy of the vaccination record if obtained from local pharmacy or Health Dept.  Encouraged to take multivitamin, vitamin d, vitamin c and zinc to increase immune system. Aware can call office if would like to have vaccine here at office. Verbalized acceptance and understanding.    Patient was given opportunity to ask questions. Patient verbalized understanding of the plan and was able to repeat key elements of the plan. All questions were answered to their satisfaction.  Minette Brine, FNP   I, Minette Brine, FNP, have reviewed all documentation for this visit. The documentation on 10/10/22 for the exam, diagnosis, procedures, and orders are all accurate and complete.   IF YOU HAVE BEEN REFERRED TO A SPECIALIST, IT MAY TAKE 1-2 WEEKS TO SCHEDULE/PROCESS THE REFERRAL. IF YOU HAVE NOT HEARD FROM US/SPECIALIST IN TWO WEEKS, PLEASE GIVE [place] A CALL AT [phone].   THE PATIENT IS ENCOURAGED TO PRACTICE SOCIAL DISTANCING DUE TO THE COVID-19 PANDEMIC.

## 2022-10-11 LAB — CMP14+EGFR
ALT: 9 IU/L (ref 0–32)
AST: 14 IU/L (ref 0–40)
Albumin/Globulin Ratio: 1.8 (ref 1.2–2.2)
Albumin: 4.7 g/dL (ref 3.9–4.9)
Alkaline Phosphatase: 69 IU/L (ref 44–121)
BUN/Creatinine Ratio: 14 (ref 9–23)
BUN: 10 mg/dL (ref 6–20)
Bilirubin Total: 0.2 mg/dL (ref 0.0–1.2)
CO2: 20 mmol/L (ref 20–29)
Calcium: 9.3 mg/dL (ref 8.7–10.2)
Chloride: 102 mmol/L (ref 96–106)
Creatinine, Ser: 0.72 mg/dL (ref 0.57–1.00)
Globulin, Total: 2.6 g/dL (ref 1.5–4.5)
Glucose: 97 mg/dL (ref 70–99)
Potassium: 4.2 mmol/L (ref 3.5–5.2)
Sodium: 138 mmol/L (ref 134–144)
Total Protein: 7.3 g/dL (ref 6.0–8.5)
eGFR: 112 mL/min/{1.73_m2} (ref >59)

## 2022-10-11 LAB — LIPID PANEL
Chol/HDL Ratio: 4.1 ratio (ref 0.0–4.4)
Cholesterol, Total: 151 mg/dL (ref 100–199)
HDL: 37 mg/dL — ABNORMAL LOW (ref >39)
LDL Chol Calc (NIH): 92 mg/dL (ref 0–99)
Triglycerides: 123 mg/dL (ref 0–149)
VLDL Cholesterol Cal: 22 mg/dL (ref 5–40)

## 2022-10-11 LAB — CBC
Hematocrit: 38.4 % (ref 34.0–46.6)
Hemoglobin: 12.6 g/dL (ref 11.1–15.9)
MCH: 29 pg (ref 26.6–33.0)
MCHC: 32.8 g/dL (ref 31.5–35.7)
MCV: 88 fL (ref 79–97)
Platelets: 253 10*3/uL (ref 150–450)
RBC: 4.35 x10E6/uL (ref 3.77–5.28)
RDW: 14.9 % (ref 11.7–15.4)
WBC: 5.9 10*3/uL (ref 3.4–10.8)

## 2022-10-11 LAB — HEMOGLOBIN A1C
Est. average glucose Bld gHb Est-mCnc: 108 mg/dL
Hgb A1c MFr Bld: 5.4 % (ref 4.8–5.6)

## 2022-10-11 LAB — VITAMIN D 25 HYDROXY (VIT D DEFICIENCY, FRACTURES): Vit D, 25-Hydroxy: 26.5 ng/mL — ABNORMAL LOW (ref 30.0–100.0)

## 2022-10-24 ENCOUNTER — Other Ambulatory Visit: Payer: Self-pay | Admitting: Nurse Practitioner

## 2022-10-24 MED ORDER — VITAMIN D (ERGOCALCIFEROL) 1.25 MG (50000 UNIT) PO CAPS: 50000.0000 [IU] | ORAL_CAPSULE | ORAL | 1 refills | Status: DC

## 2022-10-24 NOTE — Progress Notes (Signed)
Rx sent 

## 2023-03-16 ENCOUNTER — Ambulatory Visit: Payer: 59 | Admitting: Dermatology

## 2023-10-11 ENCOUNTER — Encounter: Payer: Self-pay | Admitting: Nurse Practitioner

## 2023-10-11 ENCOUNTER — Ambulatory Visit (INDEPENDENT_AMBULATORY_CARE_PROVIDER_SITE_OTHER): Payer: 59 | Admitting: Nurse Practitioner

## 2023-10-11 VITALS — BP 118/74 | HR 70 | Temp 98.3°F | Ht 70.0 in | Wt 199.8 lb

## 2023-10-11 DIAGNOSIS — E781 Pure hyperglyceridemia: Secondary | ICD-10-CM | POA: Diagnosis not present

## 2023-10-11 DIAGNOSIS — Z2821 Immunization not carried out because of patient refusal: Secondary | ICD-10-CM

## 2023-10-11 DIAGNOSIS — D229 Melanocytic nevi, unspecified: Secondary | ICD-10-CM

## 2023-10-11 DIAGNOSIS — Z8639 Personal history of other endocrine, nutritional and metabolic disease: Secondary | ICD-10-CM

## 2023-10-11 DIAGNOSIS — Z0000 Encounter for general adult medical examination without abnormal findings: Secondary | ICD-10-CM

## 2023-10-11 NOTE — Assessment & Plan Note (Signed)
 Behavior modifications discussed and diet history reviewed.   Pt will continue to exercise regularly and modify diet with low GI, plant based foods and decrease intake of processed foods.  Recommend intake of daily multivitamin, Vitamin D, and calcium.  Recommend for preventive screenings, as well as recommend immunizations that include influenza, TDAP, and Shingles

## 2023-10-11 NOTE — Assessment & Plan Note (Signed)
 She reports she had done at her job.

## 2023-10-11 NOTE — Assessment & Plan Note (Signed)
 Triglycerides were normal at last visit.

## 2023-10-11 NOTE — Progress Notes (Signed)
 Madelaine Bhat, CMA,acting as a Neurosurgeon for Arnette Felts, FNP.,have documented all relevant documentation on the behalf of Arnette Felts, FNP,as directed by  Arnette Felts, FNP while in the presence of Arnette Felts, FNP.  Subjective:    Patient ID: Laurie Gardner , female    DOB: Jun 22, 1986 , 38 y.o.   MRN: [id number]  Chief Complaint  Patient presents with   Annual Exam    HPI  Patient presents today for HM. Patient reports compliance with medication. Patient denies any chest pain, SOB, or headaches. Patient would like another referral to dermatology for small round mole on right side of her face. She is followed by Esperanza Richters at [facility]. She had her flu vaccine in Nov/Dec 2024    Past Medical History:  Diagnosis Date   Allergy    Penicillin   Medical history non-contributory      Family History  Problem Relation Age of Onset   Melanoma Mother    Migraines Mother    Hypertension Mother    Cancer Mother    Diabetes Mother    Melanoma Maternal Grandmother    Cancer Maternal Grandmother    Brain cancer Maternal Grandfather    Cancer Maternal Grandfather    Early death Maternal Grandfather    Cancer Paternal Grandmother    Other Paternal Grandfather    Cancer Maternal Aunt      Current Outpatient Medications:    Prenatal Vit-Fe Fumarate-FA (PRENATAL MULTIVITAMIN) TABS tablet, Take 1 tablet by mouth at bedtime. , Disp: , Rfl:    Allergies  Allergen Reactions   Penicillins Rash    Has patient had a PCN reaction causing immediate rash, facial/tongue/throat swelling, SOB or lightheadedness with hypotension: No Has patient had a PCN reaction causing severe rash involving mucus membranes or skin necrosis: No Has patient had a PCN reaction that required hospitalization: No Has patient had a PCN reaction occurring within the last 10 years: No If all of the above answers are "NO", then may proceed with Cephalosporin use.       The patient states she uses none  for birth control. Patient's last menstrual period was 10/02/2023 (exact date).. Negative for Dysmenorrhea and Negative for Menorrhagia. Negative for: breast discharge, breast lump(s), breast pain and breast self exam. Associated symptoms include abnormal vaginal bleeding. Pertinent negatives include abnormal bleeding (hematology), anxiety, decreased libido, depression, difficulty falling sleep, dyspareunia, history of infertility, nocturia, sexual dysfunction, sleep disturbances, urinary incontinence, urinary urgency, vaginal discharge and vaginal itching. Diet regular; she does eat a good amount of carbs and vegetables. She has recently cut off soda, water intake is good but could do better. The patient states her exercise level is moderate 3 days a week with walking and trying to get back into jogging. Has recently tried to get back into the weight room.   The patient's tobacco use is:  Social History   Tobacco Use  Smoking Status Never  Smokeless Tobacco Never   She has been exposed to passive smoke. The patient's alcohol use is:  Social History   Substance and Sexual Activity  Alcohol Use Yes   Comment: not often; not while preg   Additional information: Last pap 03/07/2022, next one scheduled for 03/07/2025.    Review of Systems  Constitutional: Negative.   HENT: Negative.    Eyes: Negative.   Respiratory: Negative.    Cardiovascular: Negative.   Gastrointestinal: Negative.   Endocrine: Negative.   Genitourinary: Negative.   Musculoskeletal: Negative.  Skin: Negative.        Right side face mole changing textures over the last couple months and darker in color. She does not intentionally go out to tan. Has had a few moles removed in the past - precancerous. Family history of melanoma.   Allergic/Immunologic: Negative.   Neurological: Negative.   Hematological: Negative.   Psychiatric/Behavioral: Negative.       Today's Vitals   10/11/23 1103  BP: 118/74  Pulse: 70  Temp:  98.3 F (36.8 C)  TempSrc: Oral  Weight: 199 lb 12.8 oz (90.6 kg)  Height: 5\' 10"  (1.778 m)  PainSc: 0-No pain   Body mass index is 28.67 kg/m.  Wt Readings from Last 3 Encounters:  10/11/23 199 lb 12.8 oz (90.6 kg)  10/10/22 196 lb (88.9 kg)  10/05/21 191 lb 3.2 oz (86.7 kg)     Objective:  Physical Exam Vitals reviewed.  Constitutional:      General: She is not in acute distress.    Appearance: Normal appearance. She is well-developed.  HENT:     Head: Normocephalic and atraumatic.     Right Ear: Hearing, tympanic membrane, ear canal and external ear normal. There is no impacted cerumen.     Left Ear: Hearing, tympanic membrane, ear canal and external ear normal. There is no impacted cerumen.     Nose: Nose normal.     Mouth/Throat:     Mouth: Mucous membranes are moist.  Eyes:     General: Lids are normal.     Extraocular Movements: Extraocular movements intact.     Conjunctiva/sclera: Conjunctivae normal.     Pupils: Pupils are equal, round, and reactive to light.     Funduscopic exam:    Right eye: No papilledema.        Left eye: No papilledema.  Neck:     Thyroid: No thyroid mass.     Vascular: No carotid bruit.  Cardiovascular:     Rate and Rhythm: Normal rate and regular rhythm.     Pulses: Normal pulses.     Heart sounds: Normal heart sounds. No murmur heard. Pulmonary:     Effort: Pulmonary effort is normal. No respiratory distress.     Breath sounds: Normal breath sounds. No wheezing.  Chest:     Chest wall: No mass.  Breasts:    Tanner Score is 5.     Right: Normal. No mass or tenderness.     Left: Normal. No mass or tenderness.  Abdominal:     General: Abdomen is flat. Bowel sounds are normal. There is no distension.     Palpations: Abdomen is soft.     Tenderness: There is no abdominal tenderness.  Genitourinary:    Rectum: Guaiac result negative.  Musculoskeletal:        General: No swelling. Normal range of motion.     Cervical back: Full  passive range of motion without pain, normal range of motion and neck supple.     Right lower leg: No edema.     Left lower leg: No edema.  Lymphadenopathy:     Upper Body:     Right upper body: No supraclavicular, axillary or pectoral adenopathy.     Left upper body: No supraclavicular, axillary or pectoral adenopathy.  Skin:    General: Skin is warm and dry.     Capillary Refill: Capillary refill takes less than 2 seconds.     Findings: Lesion (right cheekRight cheek with slightly change in color and  irregular borders) present.  Neurological:     General: No focal deficit present.     Mental Status: She is alert and oriented to person, place, and time.     Cranial Nerves: No cranial nerve deficit.     Sensory: No sensory deficit.  Psychiatric:        Mood and Affect: Mood normal.        Behavior: Behavior normal.        Thought Content: Thought content normal.        Judgment: Judgment normal.        Assessment And Plan:     Encounter for annual health examination Assessment & Plan: Behavior modifications discussed and diet history reviewed.   Pt will continue to exercise regularly and modify diet with low GI, plant based foods and decrease intake of processed foods.  Recommend intake of daily multivitamin, Vitamin D, and calcium.  Recommend for preventive screenings, as well as recommend immunizations that include influenza, TDAP, and Shingles   Orders: -     CBC with Differential/Platelet -     CMP14+EGFR  High triglycerides Assessment & Plan: Triglycerides were normal at last visit.   Orders: -     Lipid panel  Atypical nevi Assessment & Plan: Hyperpigmented lesion to right check, will refer again to dermatology.    Orders: -     Ambulatory referral to Dermatology  History of elevated glucose -     Hemoglobin A1c  Influenza vaccination declined Assessment & Plan: She reports she had done at her job.       Return for 1 year physical, 6 month chol  check. Patient was given opportunity to ask questions. Patient verbalized understanding of the plan and was able to repeat key elements of the plan. All questions were answered to their satisfaction.   Arnette Felts, FNP  I, Arnette Felts, FNP, have reviewed all documentation for this visit. The documentation on 10/11/23 for the exam, diagnosis, procedures, and orders are all accurate and complete.

## 2023-10-11 NOTE — Assessment & Plan Note (Signed)
 Hyperpigmented lesion to right check, will refer again to dermatology.

## 2023-10-12 ENCOUNTER — Encounter: Payer: Self-pay | Admitting: Nurse Practitioner

## 2023-10-12 LAB — CMP14+EGFR
ALT: 9 IU/L (ref 0–32)
AST: 15 IU/L (ref 0–40)
Albumin: 4.8 g/dL (ref 3.9–4.9)
Alkaline Phosphatase: 71 IU/L (ref 44–121)
BUN/Creatinine Ratio: 16 (ref 9–23)
BUN: 12 mg/dL (ref 6–20)
Bilirubin Total: 0.4 mg/dL (ref 0.0–1.2)
CO2: 21 mmol/L (ref 20–29)
Calcium: 9.3 mg/dL (ref 8.7–10.2)
Chloride: 103 mmol/L (ref 96–106)
Creatinine, Ser: 0.73 mg/dL (ref 0.57–1.00)
Globulin, Total: 2.6 g/dL (ref 1.5–4.5)
Glucose: 92 mg/dL (ref 70–99)
Potassium: 4.4 mmol/L (ref 3.5–5.2)
Sodium: 139 mmol/L (ref 134–144)
Total Protein: 7.4 g/dL (ref 6.0–8.5)
eGFR: 110 mL/min/{1.73_m2} (ref >59)

## 2023-10-12 LAB — CBC WITH DIFFERENTIAL/PLATELET
Basophils Absolute: 0 10*3/uL (ref 0.0–0.2)
Basos: 0 %
EOS (ABSOLUTE): 0 10*3/uL (ref 0.0–0.4)
Eos: 1 %
Hematocrit: 40.5 % (ref 34.0–46.6)
Hemoglobin: 13.3 g/dL (ref 11.1–15.9)
Immature Grans (Abs): 0 10*3/uL (ref 0.0–0.1)
Immature Granulocytes: 0 %
Lymphocytes Absolute: 1.5 10*3/uL (ref 0.7–3.1)
Lymphs: 23 %
MCH: 29 pg (ref 26.6–33.0)
MCHC: 32.8 g/dL (ref 31.5–35.7)
MCV: 88 fL (ref 79–97)
Monocytes Absolute: 0.3 10*3/uL (ref 0.1–0.9)
Monocytes: 5 %
Neutrophils Absolute: 4.4 10*3/uL (ref 1.4–7.0)
Neutrophils: 71 %
Platelets: 262 10*3/uL (ref 150–450)
RBC: 4.58 x10E6/uL (ref 3.77–5.28)
RDW: 14.5 % (ref 11.7–15.4)
WBC: 6.3 10*3/uL (ref 3.4–10.8)

## 2023-10-12 LAB — HEMOGLOBIN A1C
Est. average glucose Bld gHb Est-mCnc: 108 mg/dL
Hgb A1c MFr Bld: 5.4 % (ref 4.8–5.6)

## 2023-10-12 LAB — LIPID PANEL
Chol/HDL Ratio: 3.7 ratio (ref 0.0–4.4)
Cholesterol, Total: 162 mg/dL (ref 100–199)
HDL: 44 mg/dL (ref >39)
LDL Chol Calc (NIH): 99 mg/dL (ref 0–99)
Triglycerides: 104 mg/dL (ref 0–149)
VLDL Cholesterol Cal: 19 mg/dL (ref 5–40)

## 2024-01-15 ENCOUNTER — Encounter: Payer: Self-pay | Admitting: Dermatology

## 2024-01-15 ENCOUNTER — Ambulatory Visit: Admitting: Dermatology

## 2024-01-15 VITALS — BP 127/77 | HR 83

## 2024-01-15 DIAGNOSIS — Z1283 Encounter for screening for malignant neoplasm of skin: Secondary | ICD-10-CM

## 2024-01-15 DIAGNOSIS — L821 Other seborrheic keratosis: Secondary | ICD-10-CM

## 2024-01-15 DIAGNOSIS — D1801 Hemangioma of skin and subcutaneous tissue: Secondary | ICD-10-CM

## 2024-01-15 DIAGNOSIS — D229 Melanocytic nevi, unspecified: Secondary | ICD-10-CM

## 2024-01-15 DIAGNOSIS — Z808 Family history of malignant neoplasm of other organs or systems: Secondary | ICD-10-CM

## 2024-01-15 DIAGNOSIS — L814 Other melanin hyperpigmentation: Secondary | ICD-10-CM

## 2024-01-15 DIAGNOSIS — L578 Other skin changes due to chronic exposure to nonionizing radiation: Secondary | ICD-10-CM

## 2024-01-15 DIAGNOSIS — W908XXA Exposure to other nonionizing radiation, initial encounter: Secondary | ICD-10-CM

## 2024-01-15 DIAGNOSIS — D2372 Other benign neoplasm of skin of left lower limb, including hip: Secondary | ICD-10-CM

## 2024-01-15 DIAGNOSIS — D239 Other benign neoplasm of skin, unspecified: Secondary | ICD-10-CM

## 2024-01-15 NOTE — Patient Instructions (Addendum)
 Cerave Ultra Light Sunscreen  Skin Education : We counseled the patient regarding the following: Sun screen (SPF 30 or greater) should be applied during peak UV exposure (between 10am and 2pm) and reapplied after exercise or swimming.  The ABCDEs of melanoma were reviewed with the patient, and the importance of monthly self-examination of moles was emphasized. Should any moles change in shape or color, or itch, bleed or burn, pt will contact our office for evaluation sooner then their interval appointment.  Plan: Sunscreen Recommendations We recommended a broad spectrum sunscreen with a SPF of 30 or higher. I SPF 30 sunscreens block approximately 97 percent of the sun's harmful rays. Sunscreens should be applied at least 15 minutes prior to expected sun exposure and then every 2 hours after that as long as sun exposure continues. If swimming or exercising sunscreen should be reapplied every 45 minutes to an hour after getting wet or sweating. One ounce, or the equivalent of a shot glass full of sunscreen, is adequate to protect the skin not covered by a bathing suit. We also recommended a lip balm with a sunscreen as well. Sun protective clothing can be used in lieu of sunscreen but must be worn the entire time you are exposed to the sun's rays.   Important Information   Due to recent changes in healthcare laws, you may see results of your pathology and/or laboratory studies on MyChart before the doctors have had a chance to review them. We understand that in some cases there may be results that are confusing or concerning to you. Please understand that not all results are received at the same time and often the doctors may need to interpret multiple results in order to provide you with the best plan of care or course of treatment. Therefore, we ask that you please give us  2 business days to thoroughly review all your results before contacting the office for clarification. Should we see a critical lab  result, you will be contacted sooner.     If You Need Anything After Your Visit   If you have any questions or concerns for your doctor, please call our main line at [phone]. If no one answers, please leave a voicemail as directed and we will return your call as soon as possible. Messages left after 4 pm will be answered the following business day.    You may also send us  a message via MyChart. We typically respond to MyChart messages within 1-2 business days.  For prescription refills, please ask your pharmacy to contact our office. Our fax number is [phone].  If you have an urgent issue when the clinic is closed that cannot wait until the next business day, you can page your doctor at the number below.     Please note that while we do our best to be available for urgent issues outside of office hours, we are not available 24/7.    If you have an urgent issue and are unable to reach us , you may choose to seek medical care at your doctor's office, retail clinic, urgent care center, or emergency room.   If you have a medical emergency, please immediately call 911 or go to the emergency department. In the event of inclement weather, please call our main line at [phone] for an update on the status of any delays or closures.  Dermatology Medication Tips: Please keep the boxes that topical medications come in in order to help keep track of the instructions about where and  how to use these. Pharmacies typically print the medication instructions only on the boxes and not directly on the medication tubes.   If your medication is too expensive, please contact our office at [phone] or send us  a message through MyChart.    We are unable to tell what your co-pay for medications will be in advance as this is different depending on your insurance coverage. However, we may be able to find a substitute medication at lower cost or fill out paperwork to get insurance to cover a needed  medication.    If a prior authorization is required to get your medication covered by your insurance company, please allow us  1-2 business days to complete this process.   Drug prices often vary depending on where the prescription is filled and some pharmacies may offer cheaper prices.   The website www.goodrx.com contains coupons for medications through different pharmacies. The prices here do not account for what the cost may be with help from insurance (it may be cheaper with your insurance), but the website can give you the price if you did not use any insurance.  - You can print the associated coupon and take it with your prescription to the pharmacy.  - You may also stop by our office during regular business hours and pick up a GoodRx coupon card.  - If you need your prescription sent electronically to a different pharmacy, notify our office through [facility] or by phone at [phone]

## 2024-01-15 NOTE — Progress Notes (Signed)
   New Patient Visit   Subjective  Laurie Gardner is a 38 y.o. female who presents for the following: Skin Cancer Screening and Full Body Skin Exam  The patient presents for Total-Body Skin Exam (TBSE) for skin cancer screening and mole check. The patient has spots, moles and lesions to be evaluated, some may be new or changing.  Pt has no hx of skin cancer but mom had NMSC  The following portions of the chart were reviewed this encounter and updated as appropriate: medications, allergies, medical history  Review of Systems:  No other skin or systemic complaints except as noted in HPI or Assessment and Plan.  Objective  Well appearing patient in no apparent distress; mood and affect are within normal limits.  A full examination was performed including scalp, head, eyes, ears, nose, lips, neck, chest, axillae, abdomen, back, buttocks, bilateral upper extremities, bilateral lower extremities, hands, feet, fingers, toes, fingernails, and toenails. All findings within normal limits unless otherwise noted below.   Relevant physical exam findings are noted in the Assessment and Plan.    Assessment & Plan   SKIN CANCER SCREENING PERFORMED TODAY.  ACTINIC DAMAGE - Chronic condition, secondary to cumulative UV/sun exposure - diffuse scaly erythematous macules with underlying dyspigmentation - Recommend daily broad spectrum sunscreen SPF 30+ to sun-exposed areas, reapply every 2 hours as needed.  - Staying in the shade or wearing long sleeves, sun glasses (UVA+UVB protection) and wide brim hats (4-inch brim around the entire circumference of the hat) are also recommended for sun protection.  - Call for new or changing lesions.  MELANOCYTIC NEVI - Tan-brown and/or pink-flesh-colored symmetric macules and papules - Benign appearing on exam today - Observation - Call clinic for new or changing moles - Recommend daily use of broad spectrum spf 30+ sunscreen to sun-exposed areas.    LENTIGINES Exam: scattered tan macules Due to sun exposure Treatment Plan: Benign-appearing, observe. Recommend daily broad spectrum sunscreen SPF 30+ to sun-exposed areas, reapply every 2 hours as needed.  Call for any changes   HEMANGIOMA Exam: red papule(s) Discussed benign nature. Recommend observation. Call for changes.   SEBORRHEIC KERATOSIS right cheek - Stuck-on, waxy, tan-brown papules and/or plaques  - Benign-appearing - Discussed benign etiology and prognosis. - Observe - Call for any changes  DERMATOFIBROMA left lower leg below knee Exam: Firm pink/brown papulenodule with dimple sign. Treatment Plan: A dermatofibroma is a benign growth possibly related to trauma, such as an insect bite, cut from shaving, or inflamed acne-type bump.  Treatment options to remove include shave or excision with resulting scar and risk of recurrence.  Since benign-appearing and not bothersome, will observe for now.     Return in about 1 year (around 01/14/2025) for TBSE, 4 month spot check right cheek.  I, Darice Smock, CMA, am acting as scribe for RUFUS CHRISTELLA HOLY, MD.   Documentation: I have reviewed the above documentation for accuracy and completeness, and I agree with the above.  RUFUS CHRISTELLA HOLY, MD

## 2024-04-15 ENCOUNTER — Ambulatory Visit: Admitting: Nurse Practitioner

## 2024-04-24 ENCOUNTER — Other Ambulatory Visit: Payer: Self-pay | Admitting: Obstetrics and Gynecology

## 2024-04-24 DIAGNOSIS — R928 Other abnormal and inconclusive findings on diagnostic imaging of breast: Secondary | ICD-10-CM

## 2024-05-01 ENCOUNTER — Ambulatory Visit
Admission: RE | Admit: 2024-05-01 | Discharge: 2024-05-01 | Disposition: A | Discharge status: Home / Self Care | Source: Ambulatory Visit | Attending: Obstetrics and Gynecology | Admitting: Obstetrics and Gynecology

## 2024-05-01 ENCOUNTER — Ambulatory Visit

## 2024-05-01 DIAGNOSIS — R928 Other abnormal and inconclusive findings on diagnostic imaging of breast: Secondary | ICD-10-CM

## 2024-05-16 ENCOUNTER — Ambulatory Visit: Admitting: Dermatology

## 2024-10-14 ENCOUNTER — Encounter: Payer: Self-pay | Admitting: Nurse Practitioner

## 2025-01-14 ENCOUNTER — Ambulatory Visit: Admitting: Dermatology
